# Patient Record
Sex: Female | Born: 1980 | Race: White | Hispanic: No | Marital: Married | Smoking: Never smoker
Health system: Southern US, Community
[De-identification: ages and names within clinical notes are randomized; demographics above are authoritative.]

## PROBLEM LIST (undated history)

## (undated) DIAGNOSIS — F419 Anxiety disorder, unspecified: Secondary | ICD-10-CM

## (undated) DIAGNOSIS — N200 Calculus of kidney: Secondary | ICD-10-CM

## (undated) HISTORY — DX: Anxiety disorder, unspecified: F41.9

## (undated) HISTORY — PX: SHOULDER SURGERY: SHX246

## (undated) HISTORY — PX: BUNIONECTOMY: SHX129

## (undated) HISTORY — DX: Calculus of kidney: N20.0

---

## 2012-08-22 DIAGNOSIS — F419 Anxiety disorder, unspecified: Secondary | ICD-10-CM | POA: Insufficient documentation

## 2012-09-14 DIAGNOSIS — N6001 Solitary cyst of right breast: Secondary | ICD-10-CM | POA: Insufficient documentation

## 2015-05-26 ENCOUNTER — Ambulatory Visit (INDEPENDENT_AMBULATORY_CARE_PROVIDER_SITE_OTHER): Payer: BLUE CROSS/BLUE SHIELD | Admitting: Family Medicine

## 2015-05-26 ENCOUNTER — Other Ambulatory Visit (HOSPITAL_COMMUNITY)
Admission: RE | Admit: 2015-05-26 | Discharge: 2015-05-26 | Disposition: A | Discharge status: Home / Self Care | Payer: BLUE CROSS/BLUE SHIELD | Source: Ambulatory Visit | Attending: Family Medicine | Admitting: Family Medicine

## 2015-05-26 ENCOUNTER — Encounter: Payer: Self-pay | Admitting: Family Medicine

## 2015-05-26 VITALS — BP 118/76 | HR 60 | Ht 70.5 in | Wt 141.4 lb

## 2015-05-26 DIAGNOSIS — D72819 Decreased white blood cell count, unspecified: Secondary | ICD-10-CM

## 2015-05-26 DIAGNOSIS — Z23 Encounter for immunization: Secondary | ICD-10-CM

## 2015-05-26 DIAGNOSIS — Z0000 Encounter for general adult medical examination without abnormal findings: Secondary | ICD-10-CM | POA: Diagnosis not present

## 2015-05-26 DIAGNOSIS — Z124 Encounter for screening for malignant neoplasm of cervix: Secondary | ICD-10-CM

## 2015-05-26 DIAGNOSIS — Z01419 Encounter for gynecological examination (general) (routine) without abnormal findings: Secondary | ICD-10-CM | POA: Diagnosis present

## 2015-05-26 DIAGNOSIS — Z1151 Encounter for screening for human papillomavirus (HPV): Secondary | ICD-10-CM | POA: Insufficient documentation

## 2015-05-26 LAB — COMPREHENSIVE METABOLIC PANEL
ALK PHOS: 39 U/L (ref 33–115)
ALT: 9 U/L (ref 6–29)
AST: 15 U/L (ref 10–30)
Albumin: 4.4 g/dL (ref 3.6–5.1)
BUN: 15 mg/dL (ref 7–25)
CALCIUM: 9 mg/dL (ref 8.6–10.2)
CHLORIDE: 107 mmol/L (ref 98–110)
CO2: 25 mmol/L (ref 20–31)
Creat: 0.8 mg/dL (ref 0.50–1.10)
Glucose, Bld: 82 mg/dL (ref 65–99)
POTASSIUM: 4.2 mmol/L (ref 3.5–5.3)
Sodium: 141 mmol/L (ref 135–146)
TOTAL PROTEIN: 6.8 g/dL (ref 6.1–8.1)
Total Bilirubin: 0.5 mg/dL (ref 0.2–1.2)

## 2015-05-26 LAB — CBC WITH DIFFERENTIAL/PLATELET
BASOS ABS: 0.1 10*3/uL (ref 0.0–0.1)
Basophils Relative: 2 % — ABNORMAL HIGH (ref 0–1)
EOS ABS: 0.1 10*3/uL (ref 0.0–0.7)
Eosinophils Relative: 3 % (ref 0–5)
HEMATOCRIT: 37.8 % (ref 36.0–46.0)
Hemoglobin: 12.5 g/dL (ref 12.0–15.0)
Lymphocytes Relative: 26 % (ref 12–46)
Lymphs Abs: 0.7 10*3/uL (ref 0.7–4.0)
MCH: 30.6 pg (ref 26.0–34.0)
MCHC: 33.1 g/dL (ref 30.0–36.0)
MCV: 92.6 fL (ref 78.0–100.0)
MONOS PCT: 12 % (ref 3–12)
MPV: 9.8 fL (ref 8.6–12.4)
Monocytes Absolute: 0.3 10*3/uL (ref 0.1–1.0)
NEUTROS PCT: 57 % (ref 43–77)
Neutro Abs: 1.5 10*3/uL — ABNORMAL LOW (ref 1.7–7.7)
PLATELETS: 220 10*3/uL (ref 150–400)
RBC: 4.08 MIL/uL (ref 3.87–5.11)
RDW: 12.7 % (ref 11.5–15.5)
WBC: 2.7 10*3/uL — ABNORMAL LOW (ref 4.0–10.5)

## 2015-05-26 LAB — POCT URINALYSIS DIPSTICK
Bilirubin, UA: NEGATIVE
Blood, UA: NEGATIVE
Glucose, UA: NEGATIVE
Ketones, UA: NEGATIVE
LEUKOCYTES UA: NEGATIVE
NITRITE UA: NEGATIVE
PH UA: 6
PROTEIN UA: NEGATIVE
Spec Grav, UA: >=1.03
UROBILINOGEN UA: NEGATIVE

## 2015-05-26 LAB — LIPID PANEL
CHOL/HDL RATIO: 2.5 ratio (ref <=5.0)
CHOLESTEROL: 163 mg/dL (ref 125–200)
HDL: 65 mg/dL (ref >=46)
LDL Cholesterol: 90 mg/dL (ref <130)
TRIGLYCERIDES: 40 mg/dL (ref <150)
VLDL: 8 mg/dL (ref <30)

## 2015-05-26 NOTE — Progress Notes (Signed)
Subjective:    Patient ID: Chelsea Stephenson, female    DOB: 07/27/1980, 35 y.o.   MRN: [id number]  HPI Chief Complaint  Patient presents with  . fasting cpe    fasting cpe, pap today. pt declines flu shot. no other concerns   She is new to the practice and here to establish primary care. She has no complaints today and is fasting. She would like a complete physical examination.  Other providers: Dr. Renda [facility]. [facility]- foot issues  Last physical exam: 3 years ago Pap smear: 3 years ago- always normal LMP: 05/08/2015 regular and no issues  Past medical history: none Past surgical history: bunionectomy, right shoulder surgery Family history: parents alive and healthy. MGM mixed ovarian and uterus cancer died at age 44, MGF- MI. PGM- stomach cancer. 2 siblings healthy.  Mammogram: never Colonoscopy: never  Immunizations: Tdap unknown, flu never.   Dentist: Dr. Caryn Section, up to date, yesterday Eye exam: annual exams, contact lenses.  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  Diet: good and healthy food.  Reviewed allergies, medications, past medical, surgical, social and family history.   Review of Systems Review of Systems Constitutional: -fever, -chills, -sweats, -unexpected weight change,-fatigue ENT: -runny nose, -ear pain, -sore throat Cardiology:  -chest pain, -palpitations, -edema Respiratory: -cough, -shortness of breath, -wheezing Gastroenterology: -abdominal pain, -nausea, -vomiting, -diarrhea, -constipation  Hematology: -bleeding or bruising problems Musculoskeletal: -arthralgias, -myalgias, -joint swelling, -back pain Ophthalmology: -vision changes Urology: -dysuria, -difficulty urinating, -hematuria, -urinary frequency, -urgency Neurology: -headache, -weakness, -tingling, -numbness       Objective:   Physical  Exam BP 118/76 mmHg  Pulse 60  Ht 5' 10.5" (1.791 m)  Wt 141 lb 6.4 oz (64.139 kg)  BMI 20.00 kg/m2  LMP 05/08/2015  General Appearance:    Alert, cooperative, no distress, appears stated age  Head:    Normocephalic, without obvious abnormality, atraumatic  Eyes:    PERRL, conjunctiva/corneas clear, EOM's intact, fundi    benign  Ears:    Normal TM's and external ear canals  Nose:   Nares normal, mucosa normal, no drainage or sinus   tenderness  Throat:   Lips, mucosa, and tongue normal; teeth and gums normal  Neck:   Supple, no lymphadenopathy;  thyroid:  no   enlargement/tenderness/nodules; no carotid   bruit or JVD  Back:    Spine nontender, no curvature, ROM normal, no CVA     tenderness  Lungs:     Clear to auscultation bilaterally without wheezes, rales or     ronchi; respirations unlabored  Chest Wall:    No tenderness or deformity   Heart:    Regular rate and rhythm, S1 and S2 normal, no murmur, rub   or gallop  Breast Exam:    No tenderness, masses, or nipple discharge or inversion.      No axillary lymphadenopathy  Abdomen:     Soft, non-tender, nondistended, normoactive bowel sounds,    no masses, no hepatosplenomegaly  Genitalia:    Normal external genitalia without lesions.  BUS and vagina normal; cervix without lesions, or cervical motion tenderness. No abnormal vaginal discharge.  Uterus and adnexa not enlarged, nontender, no masses.  Pap performed  Rectal:    Not performed due to age<40 and no related complaints  Extremities:   No clubbing, cyanosis or edema  Pulses:  2+ and symmetric all extremities  Skin:   Skin color, texture, turgor normal, no rashes or lesions  Lymph nodes:   Cervical, supraclavicular, and axillary nodes normal  Neurologic:   CNII-XII intact, normal strength, sensation and gait; reflexes 2+ and symmetric throughout          Psych:   Normal mood, affect, hygiene and grooming.     Urinalysis dipstick: negative     Assessment & Plan:  Routine  general medical examination at a health care facility - Plan: CBC with Differential/Platelet, Comprehensive metabolic panel, POCT urinalysis dipstick, Lipid panel  Need for Tdap vaccination - Plan: Tdap vaccine greater than or equal to 7yo IM  Screening for cervical cancer - Plan: Cytology - PAP  Recommend that she continue taking good care of herself by eating healthy and eating a minimum of 150 minutes of physical activity per week. She is currently exceeding this and doing well. Tdap given today. Refuses flu shot. Discussed health promotion and safety. Continue using sunscreen, wearing seatbelt and check smoke detectors.  Will follow-up pending lab results.

## 2015-05-26 NOTE — Patient Instructions (Signed)

## 2015-05-27 LAB — CYTOLOGY - PAP

## 2015-05-27 NOTE — Addendum Note (Signed)
Addended by: Minette Headland A on: 05/27/2015 09:04 AM   Modules accepted: Orders

## 2015-06-25 ENCOUNTER — Other Ambulatory Visit: Payer: BLUE CROSS/BLUE SHIELD

## 2015-06-25 DIAGNOSIS — D72819 Decreased white blood cell count, unspecified: Secondary | ICD-10-CM

## 2015-06-25 LAB — CBC WITH DIFFERENTIAL/PLATELET
BASOS PCT: 2 % — AB (ref 0–1)
Basophils Absolute: 0.1 10*3/uL (ref 0.0–0.1)
EOS ABS: 0.1 10*3/uL (ref 0.0–0.7)
EOS PCT: 3 % (ref 0–5)
HCT: 38.4 % (ref 36.0–46.0)
Hemoglobin: 12.6 g/dL (ref 12.0–15.0)
LYMPHS ABS: 1.2 10*3/uL (ref 0.7–4.0)
Lymphocytes Relative: 34 % (ref 12–46)
MCH: 30.5 pg (ref 26.0–34.0)
MCHC: 32.8 g/dL (ref 30.0–36.0)
MCV: 93 fL (ref 78.0–100.0)
MONOS PCT: 14 % — AB (ref 3–12)
MPV: 10 fL (ref 8.6–12.4)
Monocytes Absolute: 0.5 10*3/uL (ref 0.1–1.0)
NEUTROS PCT: 47 % (ref 43–77)
Neutro Abs: 1.6 10*3/uL — ABNORMAL LOW (ref 1.7–7.7)
Platelets: 224 10*3/uL (ref 150–400)
RBC: 4.13 MIL/uL (ref 3.87–5.11)
RDW: 13.2 % (ref 11.5–15.5)
WBC: 3.4 10*3/uL — ABNORMAL LOW (ref 4.0–10.5)

## 2015-09-20 ENCOUNTER — Encounter: Payer: Self-pay | Admitting: Family Medicine

## 2016-02-17 DIAGNOSIS — Z0100 Encounter for examination of eyes and vision without abnormal findings: Secondary | ICD-10-CM | POA: Diagnosis not present

## 2016-05-27 ENCOUNTER — Encounter: Payer: BLUE CROSS/BLUE SHIELD | Admitting: Family Medicine

## 2016-08-11 ENCOUNTER — Ambulatory Visit (INDEPENDENT_AMBULATORY_CARE_PROVIDER_SITE_OTHER): Payer: 59 | Admitting: Family Medicine

## 2016-08-11 ENCOUNTER — Encounter: Payer: Self-pay | Admitting: Family Medicine

## 2016-08-11 VITALS — BP 116/74 | HR 87 | Temp 98.0°F | Ht 70.5 in | Wt 141.2 lb

## 2016-08-11 DIAGNOSIS — E559 Vitamin D deficiency, unspecified: Secondary | ICD-10-CM

## 2016-08-11 DIAGNOSIS — F411 Generalized anxiety disorder: Secondary | ICD-10-CM

## 2016-08-11 DIAGNOSIS — Z0000 Encounter for general adult medical examination without abnormal findings: Secondary | ICD-10-CM | POA: Diagnosis not present

## 2016-08-11 DIAGNOSIS — R7989 Other specified abnormal findings of blood chemistry: Secondary | ICD-10-CM

## 2016-08-11 LAB — CBC WITH DIFFERENTIAL/PLATELET
Basophils Absolute: 0 10*3/uL (ref 0.0–0.1)
Basophils Relative: 1.4 % (ref 0.0–3.0)
Eosinophils Absolute: 0.1 10*3/uL (ref 0.0–0.7)
Eosinophils Relative: 2.1 % (ref 0.0–5.0)
HCT: 38.5 % (ref 36.0–46.0)
Hemoglobin: 13 g/dL (ref 12.0–15.0)
Lymphocytes Relative: 25.6 % (ref 12.0–46.0)
Lymphs Abs: 0.7 10*3/uL (ref 0.7–4.0)
MCHC: 33.6 g/dL (ref 30.0–36.0)
MCV: 94 fl (ref 78.0–100.0)
Monocytes Absolute: 0.3 10*3/uL (ref 0.1–1.0)
Monocytes Relative: 8.9 % (ref 3.0–12.0)
Neutro Abs: 1.8 10*3/uL (ref 1.4–7.7)
Neutrophils Relative %: 62 % (ref 43.0–77.0)
Platelets: 227 10*3/uL (ref 150.0–400.0)
RBC: 4.1 Mil/uL (ref 3.87–5.11)
RDW: 13.1 % (ref 11.5–15.5)
WBC: 2.9 10*3/uL — ABNORMAL LOW (ref 4.0–10.5)

## 2016-08-11 LAB — COMPREHENSIVE METABOLIC PANEL
ALT: 11 U/L (ref 0–35)
AST: 16 U/L (ref 0–37)
Albumin: 4.5 g/dL (ref 3.5–5.2)
Alkaline Phosphatase: 35 U/L — ABNORMAL LOW (ref 39–117)
BUN: 18 mg/dL (ref 6–23)
CO2: 27 mEq/L (ref 19–32)
Calcium: 9.6 mg/dL (ref 8.4–10.5)
Chloride: 106 mEq/L (ref 96–112)
Creatinine, Ser: 0.92 mg/dL (ref 0.40–1.20)
GFR: 72.82 mL/min (ref >60.00)
Glucose, Bld: 100 mg/dL — ABNORMAL HIGH (ref 70–99)
Potassium: 4.5 mEq/L (ref 3.5–5.1)
Sodium: 138 mEq/L (ref 135–145)
Total Bilirubin: 0.6 mg/dL (ref 0.2–1.2)
Total Protein: 7.3 g/dL (ref 6.0–8.3)

## 2016-08-11 LAB — TSH: TSH: 2 u[IU]/mL (ref 0.35–4.50)

## 2016-08-11 LAB — VITAMIN D 25 HYDROXY (VIT D DEFICIENCY, FRACTURES): VITD: 38.88 ng/mL (ref 30.00–100.00)

## 2016-08-11 MED ORDER — SERTRALINE HCL 50 MG PO TABS: 50.0000 mg | ORAL_TABLET | Freq: Every day | ORAL | 3 refills | Status: DC

## 2016-08-11 NOTE — Progress Notes (Signed)
Subjective:    Chelsea Stephenson is a 36 y.o. female and is here for a comprehensive physical exam.  Anxiety  Presents for initial visit. The problem has been unchanged. Symptoms include excessive worry and nervous/anxious behavior. Patient reports no chest pain, compulsions, confusion, decreased concentration, depressed mood, dizziness, dry mouth, feeling of choking, hyperventilation, insomnia, irritability, malaise, muscle tension, nausea, obsessions, palpitations, panic, shortness of breath or suicidal ideas. Symptoms occur most days. The severity of symptoms is moderate. The symptoms are aggravated by social activities. The quality of sleep is good. Nighttime awakenings: none.     Health Maintenance Due  Topic Date Due  . HIV Screening  02/17/1994    PMHx, SurgHx, SocialHx, Medications, and Allergies were reviewed in the Visit Navigator and updated as appropriate.   No past medical history on file.  Past Surgical History:  Procedure Laterality Date  . BUNIONECTOMY     Family History  Problem Relation Age of Onset  . Cancer Maternal Grandmother   . Heart disease Maternal Grandfather    Social History  Substance Use Topics  . Smoking status: Never Smoker  . Smokeless tobacco: Never Used  . Alcohol use 0.0 oz/week     Comment: occassionally   Review of Systems:   Review of Systems  Constitutional: Negative for chills, fever, irritability and malaise/fatigue.  HENT: Negative for congestion, ear pain, sinus pain and sore throat.   Eyes: Negative for blurred vision and double vision.  Respiratory: Negative for cough, shortness of breath and wheezing.   Cardiovascular: Negative for chest pain, palpitations and leg swelling.  Gastrointestinal: Negative for abdominal pain, constipation, diarrhea and nausea.  Genitourinary: Negative for dysuria.  Musculoskeletal: Negative for back pain, joint pain and neck pain.  Neurological: Negative for dizziness and headaches.    Psychiatric/Behavioral: Negative for confusion, decreased concentration, depression, hallucinations, memory loss and suicidal ideas. The patient is nervous/anxious. The patient does not have insomnia.    Objective:   BP 116/74   Pulse 87   Temp 98 F (36.7 C) (Oral)   Ht 5' 10.5" (1.791 m)   Wt 141 lb 3.2 oz (64 kg)   LMP 07/28/2016   SpO2 96%   BMI 19.97 kg/m   General Appearance:    Alert, cooperative, no distress, appears stated age  Head:    Normocephalic, without obvious abnormality, atraumatic  Eyes:    PERRL, conjunctiva/corneas clear, EOM's intact, fundi    benign, both eyes  Ears:    Normal TM's and external ear canals, both ears  Nose:   Nares normal, septum midline, mucosa normal  Throat:   Lips, mucosa, and tongue normal; teeth and gums normal  Neck:   Supple, symmetrical, trachea midline, no adenopathy;    thyroid:  no enlargement/tenderness/nodules; no carotid   bruit or JVD  Back:     Symmetric, no curvature, ROM normal, no CVA tenderness  Lungs:     Clear to auscultation bilaterally, respirations unlabored  Chest Wall:    No tenderness or deformity   Heart:    Regular rate and rhythm, S1 and S2 normal, no murmur, rub or gallop  Breast Exam:    No tenderness, masses, or nipple abnormality  Abdomen:     Soft, non-tender, bowel sounds, no masses, no organomegaly  Extremities:   Extremities normal, atraumatic, no cyanosis or edema  Pulses:   2+ and symmetric all extremities  Skin:   Skin color, texture, turgor normal, no rashes or lesions  Lymph nodes:  Cervical, supraclavicular, and axillary nodes normal  Neurologic:   CNII-XII intact, normal strength, sensation and reflexes throughout    Assessment/Plan:   [name] was seen today for annual exam.  Diagnoses and all orders for this visit:  Routine physical examination -     CBC with Differential/Platelet -     Comprehensive metabolic panel  GAD (generalized anxiety disorder) -     TSH -     sertraline  (ZOLOFT) 50 MG tablet; Take 1 tablet (50 mg total) by mouth at bedtime.  Low vitamin D level -     VITAMIN D 25 Hydroxy (Vit-D Deficiency, Fractures)   Well Adult Exam: Labs ordered: Yes. Patient counseling was done. See below for items discussed. Discussed the patient's BMI. The BMI BMI is in the acceptable range Follow up in one year.  Patient Counseling:   [x]     Nutrition: Stressed importance of moderation in sodium/caffeine intake, saturated fat and cholesterol, caloric balance, sufficient intake of fresh fruits, vegetables, fiber, calcium, iron, and 1 mg of folate supplement per day (for females capable of pregnancy).   [x]      Stressed the importance of regular exercise.    [x]     Substance Abuse: Discussed cessation/primary prevention of tobacco, alcohol, or other drug use; driving or other dangerous activities under the influence; availability of treatment for abuse.    [x]      Injury prevention: Discussed safety belts, safety helmets, smoke detector, smoking near bedding or upholstery.    [x]      Sexuality: Discussed sexually transmitted diseases, partner selection, use of condoms, avoidance of unintended pregnancy  and contraceptive alternatives.    [x]     Dental health: Discussed importance of regular tooth brushing, flossing, and dental visits.   [x]      Health maintenance and immunizations reviewed. Please refer to Health maintenance section.   Briscoe Deutscher, DO [place] [facility] [place] served as Education administrator during this visit. History, Physical, and Plan performed by medical provider. Documentation and orders reviewed and attested to. Briscoe Deutscher, D.O.

## 2016-08-11 NOTE — Progress Notes (Signed)
Pre visit review using our clinic review tool, if applicable. No additional management support is needed unless otherwise documented below in the visit note. 

## 2016-11-24 DIAGNOSIS — D225 Melanocytic nevi of trunk: Secondary | ICD-10-CM | POA: Diagnosis not present

## 2016-11-24 DIAGNOSIS — D1800 Hemangioma unspecified site: Secondary | ICD-10-CM | POA: Diagnosis not present

## 2016-11-24 DIAGNOSIS — D2272 Melanocytic nevi of left lower limb, including hip: Secondary | ICD-10-CM | POA: Diagnosis not present

## 2016-11-24 DIAGNOSIS — L814 Other melanin hyperpigmentation: Secondary | ICD-10-CM | POA: Diagnosis not present

## 2017-02-18 DIAGNOSIS — H5213 Myopia, bilateral: Secondary | ICD-10-CM | POA: Diagnosis not present

## 2017-02-18 DIAGNOSIS — H52221 Regular astigmatism, right eye: Secondary | ICD-10-CM | POA: Diagnosis not present

## 2017-12-05 DIAGNOSIS — B351 Tinea unguium: Secondary | ICD-10-CM | POA: Diagnosis not present

## 2017-12-05 DIAGNOSIS — L814 Other melanin hyperpigmentation: Secondary | ICD-10-CM | POA: Diagnosis not present

## 2017-12-05 DIAGNOSIS — D225 Melanocytic nevi of trunk: Secondary | ICD-10-CM | POA: Diagnosis not present

## 2017-12-05 DIAGNOSIS — D2272 Melanocytic nevi of left lower limb, including hip: Secondary | ICD-10-CM | POA: Diagnosis not present

## 2017-12-05 DIAGNOSIS — D1800 Hemangioma unspecified site: Secondary | ICD-10-CM | POA: Diagnosis not present

## 2018-01-05 ENCOUNTER — Encounter: Payer: Self-pay | Admitting: Family Medicine

## 2018-01-05 ENCOUNTER — Ambulatory Visit (INDEPENDENT_AMBULATORY_CARE_PROVIDER_SITE_OTHER): Payer: 59 | Admitting: Family Medicine

## 2018-01-05 VITALS — BP 110/68 | HR 62 | Temp 98.6°F | Ht 70.5 in | Wt 143.6 lb

## 2018-01-05 DIAGNOSIS — Z114 Encounter for screening for human immunodeficiency virus [HIV]: Secondary | ICD-10-CM

## 2018-01-05 DIAGNOSIS — R7989 Other specified abnormal findings of blood chemistry: Secondary | ICD-10-CM | POA: Diagnosis not present

## 2018-01-05 DIAGNOSIS — Z13220 Encounter for screening for lipoid disorders: Secondary | ICD-10-CM

## 2018-01-05 DIAGNOSIS — Z0000 Encounter for general adult medical examination without abnormal findings: Secondary | ICD-10-CM

## 2018-01-05 DIAGNOSIS — R739 Hyperglycemia, unspecified: Secondary | ICD-10-CM

## 2018-01-05 DIAGNOSIS — Z23 Encounter for immunization: Secondary | ICD-10-CM

## 2018-01-05 DIAGNOSIS — F411 Generalized anxiety disorder: Secondary | ICD-10-CM | POA: Diagnosis not present

## 2018-01-05 LAB — CBC WITH DIFFERENTIAL/PLATELET
Basophils Absolute: 0 10*3/uL (ref 0.0–0.1)
Basophils Relative: 1.1 % (ref 0.0–3.0)
Eosinophils Absolute: 0.1 10*3/uL (ref 0.0–0.7)
Eosinophils Relative: 1.7 % (ref 0.0–5.0)
HCT: 37.3 % (ref 36.0–46.0)
Hemoglobin: 12.6 g/dL (ref 12.0–15.0)
Lymphocytes Relative: 23.2 % (ref 12.0–46.0)
Lymphs Abs: 0.8 10*3/uL (ref 0.7–4.0)
MCHC: 33.8 g/dL (ref 30.0–36.0)
MCV: 94.6 fl (ref 78.0–100.0)
Monocytes Absolute: 0.3 10*3/uL (ref 0.1–1.0)
Monocytes Relative: 9 % (ref 3.0–12.0)
Neutro Abs: 2.2 10*3/uL (ref 1.4–7.7)
Neutrophils Relative %: 65 % (ref 43.0–77.0)
Platelets: 216 10*3/uL (ref 150.0–400.0)
RBC: 3.95 Mil/uL (ref 3.87–5.11)
RDW: 12.8 % (ref 11.5–15.5)
WBC: 3.3 10*3/uL — ABNORMAL LOW (ref 4.0–10.5)

## 2018-01-05 LAB — LIPID PANEL
Cholesterol: 150 mg/dL (ref 0–200)
HDL: 55.6 mg/dL (ref >39.00)
LDL Cholesterol: 86 mg/dL (ref 0–99)
NonHDL: 94.53
Total CHOL/HDL Ratio: 3
Triglycerides: 41 mg/dL (ref 0.0–149.0)
VLDL: 8.2 mg/dL (ref 0.0–40.0)

## 2018-01-05 LAB — COMPREHENSIVE METABOLIC PANEL
ALT: 10 U/L (ref 0–35)
AST: 15 U/L (ref 0–37)
Albumin: 4.6 g/dL (ref 3.5–5.2)
Alkaline Phosphatase: 37 U/L — ABNORMAL LOW (ref 39–117)
BUN: 24 mg/dL — ABNORMAL HIGH (ref 6–23)
CO2: 25 mEq/L (ref 19–32)
Calcium: 9.5 mg/dL (ref 8.4–10.5)
Chloride: 108 mEq/L (ref 96–112)
Creatinine, Ser: 0.88 mg/dL (ref 0.40–1.20)
GFR: 76.08 mL/min (ref >60.00)
Glucose, Bld: 97 mg/dL (ref 70–99)
Potassium: 4.5 mEq/L (ref 3.5–5.1)
Sodium: 140 mEq/L (ref 135–145)
Total Bilirubin: 0.5 mg/dL (ref 0.2–1.2)
Total Protein: 7.5 g/dL (ref 6.0–8.3)

## 2018-01-05 LAB — VITAMIN D 25 HYDROXY (VIT D DEFICIENCY, FRACTURES): VITD: 41.35 ng/mL (ref 30.00–100.00)

## 2018-01-05 LAB — HEMOGLOBIN A1C: Hgb A1c MFr Bld: 5.6 % (ref 4.6–6.5)

## 2018-01-05 NOTE — Patient Instructions (Addendum)
Deplin is 15 mg of active folate - you can find this on [employer].  Marland Kitchen.It takes about 2 weeks for protection to develop after vaccination.  There are many flu viruses, and they are always changing. Each year a new flu vaccine is made to protect against three or four viruses that are likely to cause disease in the upcoming flu season. Even when the vaccine doesn't exactly match these viruses, it may still provide some protection.   Influenza vaccine does not cause flu.  Influenza vaccine may be given at the same time as other vaccines.  3. Talk with your health care provider  Tell your vaccine provider if the person getting the vaccine: ; Has had an allergic reaction after a previous dose of influenza vaccine, or has any severe, life-threatening allergies.  ; Has ever had Guillain-Barr Syndrome (also called GBS).  In some cases, your health care provider may decide to postpone influenza vaccination to a future visit.  People with minor illnesses, such as a cold, may be vaccinated. People who are moderately or severely ill should usually wait until they recover before getting influenza vaccine.  Your health care provider can give you more information.  4. Risks of a reaction  ; Soreness, redness, and swelling where shot is given, fever, muscle aches, and headache can happen after influenza vaccine. ; There may be a very small increased risk of Guillain-Barr Syndrome (GBS) after inactivated influenza vaccine (the flu shot).  Young children who get the flu shot along with pneumococcal vaccine (PCV13), and/or DTaP vaccine at the same time might be slightly more likely to have a seizure caused by fever. Tell your health care provider if a child who is getting flu vaccine has ever had a seizure.  People sometimes faint after medical procedures, including vaccination. Tell your provider if you feel dizzy or have vision changes or ringing in the ears.  As with any medicine, there is a very remote  chance of a vaccine causing a severe allergic reaction, other serious injury, or death.  5. What if there is a serious problem?  An allergic reaction could occur after the vaccinated person leaves the clinic. If you see signs of a severe allergic reaction (hives, swelling of the face and throat, difficulty breathing, a fast heartbeat, dizziness, or weakness), call 9-1-1 and get the person to the nearest hospital.  For other signs that concern you, call your health care provider.  Adverse reactions should be reported to the Vaccine Adverse Event Reporting System (VAERS). Your health care provider will usually file this report, or you can do it yourself. Visit the VAERS website at www.vaers.SamedayNews.es or call 4705255592.  VAERS is only for reporting reactions, and VAERS staff do not give medical advice.  6. The National Vaccine Injury Compensation Program  The [employer] Vaccine Injury Compensation Program (VICP) is a federal program that was created to compensate people who may have been injured by certain vaccines. Visit the VICP website at GoldCloset.com.ee or call 703-884-2846 to learn about the program and about filing a claim. There is a time limit to file a claim for compensation.  7. How can I learn more?  ; Ask your health care provider.  ; Call your local or state health department. ; Contact the Centers for Disease Control and Prevention (CDC): - Call 650-818-0550 (1-800-CDC-INFO) or - Visit CDC's influenza website at https://gibson.com/  Vaccine Information Statement (Interim) Inactivated Influenza Vaccine  11/30/2017 42 U.S.C.  [phone]   Department of Health and  Geneticist, molecular for Disease Retail banker Use Only

## 2018-01-05 NOTE — Progress Notes (Signed)
Subjective:    Chelsea Stephenson is a 37 y.o. female and is here for a comprehensive physical exam.  Health Maintenance Due  Topic Date Due  . HIV Screening  02/17/1994   PMHx, SurgHx, SocialHx, Medications, and Allergies were reviewed in the Visit Navigator and updated as appropriate.   No past medical history on file.   Past Surgical History:  Procedure Laterality Date  . BUNIONECTOMY       Family History  Problem Relation Age of Onset  . Cancer Maternal Grandmother   . Heart disease Maternal Grandfather     Social History   Tobacco Use  . Smoking status: Never Smoker  . Smokeless tobacco: Never Used  Substance Use Topics  . Alcohol use: Yes    Alcohol/week: 0.0 standard drinks    Comment: occassionally  . Drug use: No    Review of Systems:   Pertinent items are noted in the HPI. Otherwise, ROS is negative.  Objective:   BP 110/68   Pulse 62   Temp 98.6 F (37 C) (Oral)   Ht 5' 10.5" (1.791 m)   Wt 143 lb 9.6 oz (65.1 kg)   LMP 01/05/2018   SpO2 98%   BMI 20.31 kg/m    General appearance: alert, cooperative and appears stated age. Head: normocephalic, without obvious abnormality, atraumatic. Neck: no adenopathy, supple, symmetrical, trachea midline; thyroid not enlarged, symmetric, no tenderness/mass/nodules. Lungs: clear to auscultation bilaterally. Heart: regular rate and rhythm Abdomen: soft, non-tender; no masses,  no organomegaly. Extremities: extremities normal, atraumatic, no cyanosis or edema. Skin: skin color, texture, turgor normal, no rashes or lesions. Lymph: cervical, supraclavicular, and axillary nodes normal; no abnormal inguinal nodes palpated. Neurologic: grossly normal.  Assessment/Plan:   Chelsea Stephenson was seen today for annual exam.  Diagnoses and all orders for this visit:  Routine physical examination  GAD (generalized anxiety disorder) Comments: Resolved.  Low vitamin D level -     VITAMIN D 25 Hydroxy (Vit-D Deficiency,  Fractures)  Screening for HIV (human immunodeficiency virus) -     HIV Antibody (routine testing w rflx)  Screening for lipid disorders -     Lipid panel  Abnormal CBC -     CBC with Differential/Platelet -     Comprehensive metabolic panel  Hyperglycemia -     Hemoglobin A1c -     Comprehensive metabolic panel  Need for immunization against influenza -     Flu Vaccine QUAD 36+ mos IM   Patient Counseling:   [x]     Nutrition: Stressed importance of moderation in sodium/caffeine intake, saturated fat and cholesterol, caloric balance, sufficient intake of fresh fruits, vegetables, fiber, calcium, iron, and 1 mg of folate supplement per day (for females capable of pregnancy).   [x]      Stressed the importance of regular exercise.    [x]     Substance Abuse: Discussed cessation/primary prevention of tobacco, alcohol, or other drug use; driving or other dangerous activities under the influence; availability of treatment for abuse.    [x]      Injury prevention: Discussed safety belts, safety helmets, smoke detector, smoking near bedding or upholstery.    [x]      Sexuality: Discussed sexually transmitted diseases, partner selection, use of condoms, avoidance of unintended pregnancy  and contraceptive alternatives.    [x]     Dental health: Discussed importance of regular tooth brushing, flossing, and dental visits.   [x]      Health maintenance and immunizations reviewed. Please refer to Health maintenance section.  Briscoe Deutscher, DO [place]

## 2018-01-06 ENCOUNTER — Encounter: Payer: Self-pay | Admitting: Family Medicine

## 2018-01-06 LAB — HIV ANTIBODY (ROUTINE TESTING W REFLEX): HIV 1&2 Ab, 4th Generation: NONREACTIVE

## 2018-01-16 ENCOUNTER — Other Ambulatory Visit: Payer: Self-pay

## 2018-01-16 ENCOUNTER — Ambulatory Visit (INDEPENDENT_AMBULATORY_CARE_PROVIDER_SITE_OTHER): Payer: 59 | Admitting: Family Medicine

## 2018-01-16 ENCOUNTER — Other Ambulatory Visit (INDEPENDENT_AMBULATORY_CARE_PROVIDER_SITE_OTHER): Payer: 59

## 2018-01-16 DIAGNOSIS — M2550 Pain in unspecified joint: Secondary | ICD-10-CM | POA: Diagnosis not present

## 2018-01-16 DIAGNOSIS — R5383 Other fatigue: Secondary | ICD-10-CM | POA: Insufficient documentation

## 2018-01-16 LAB — CBC WITH DIFFERENTIAL/PLATELET
BASOS ABS: 0.1 10*3/uL (ref 0.0–0.1)
BASOS PCT: 1.2 % (ref 0.0–3.0)
EOS PCT: 2.7 % (ref 0.0–5.0)
Eosinophils Absolute: 0.1 10*3/uL (ref 0.0–0.7)
HCT: 37.7 % (ref 36.0–46.0)
Hemoglobin: 12.8 g/dL (ref 12.0–15.0)
LYMPHS ABS: 1.2 10*3/uL (ref 0.7–4.0)
LYMPHS PCT: 27.3 % (ref 12.0–46.0)
MCHC: 34 g/dL (ref 30.0–36.0)
MCV: 94.3 fl (ref 78.0–100.0)
MONOS PCT: 9.8 % (ref 3.0–12.0)
Monocytes Absolute: 0.4 10*3/uL (ref 0.1–1.0)
NEUTROS ABS: 2.5 10*3/uL (ref 1.4–7.7)
NEUTROS PCT: 59 % (ref 43.0–77.0)
PLATELETS: 231 10*3/uL (ref 150.0–400.0)
RBC: 4 Mil/uL (ref 3.87–5.11)
RDW: 12.9 % (ref 11.5–15.5)
WBC: 4.3 10*3/uL (ref 4.0–10.5)

## 2018-01-16 LAB — COMPREHENSIVE METABOLIC PANEL
ALT: 13 U/L (ref 0–35)
AST: 19 U/L (ref 0–37)
Albumin: 4.4 g/dL (ref 3.5–5.2)
Alkaline Phosphatase: 41 U/L (ref 39–117)
BILIRUBIN TOTAL: 0.5 mg/dL (ref 0.2–1.2)
BUN: 18 mg/dL (ref 6–23)
CHLORIDE: 106 meq/L (ref 96–112)
CO2: 28 meq/L (ref 19–32)
Calcium: 9.4 mg/dL (ref 8.4–10.5)
Creatinine, Ser: 0.85 mg/dL (ref 0.40–1.20)
GFR: 79.17 mL/min (ref >60.00)
GLUCOSE: 91 mg/dL (ref 70–99)
Potassium: 4.2 mEq/L (ref 3.5–5.1)
Sodium: 140 mEq/L (ref 135–145)
Total Protein: 7.3 g/dL (ref 6.0–8.3)

## 2018-01-16 LAB — IBC PANEL
Iron: 70 ug/dL (ref 42–145)
SATURATION RATIOS: 20.4 % (ref 20.0–50.0)
TRANSFERRIN: 245 mg/dL (ref 212.0–360.0)

## 2018-01-16 LAB — TSH: TSH: 2.27 u[IU]/mL (ref 0.35–4.50)

## 2018-01-16 LAB — SEDIMENTATION RATE: SED RATE: 9 mm/h (ref 0–20)

## 2018-01-16 NOTE — Progress Notes (Signed)
Patient has some fatigue.  Getting labs to check  See orders,  No physical exam noted other than enlarge posterior lymph nodes, mild enlargement spleen  Will see if labs abnormal and then discuss

## 2018-01-17 LAB — EPSTEIN-BARR VIRUS VCA ANTIBODY PANEL: EBV NA IGG: 164 U/mL — AB

## 2018-12-10 DIAGNOSIS — L814 Other melanin hyperpigmentation: Secondary | ICD-10-CM | POA: Diagnosis not present

## 2018-12-10 DIAGNOSIS — D225 Melanocytic nevi of trunk: Secondary | ICD-10-CM | POA: Diagnosis not present

## 2018-12-10 DIAGNOSIS — D2272 Melanocytic nevi of left lower limb, including hip: Secondary | ICD-10-CM | POA: Diagnosis not present

## 2019-05-17 DIAGNOSIS — R87610 Atypical squamous cells of undetermined significance on cytologic smear of cervix (ASC-US): Secondary | ICD-10-CM | POA: Diagnosis not present

## 2019-05-17 DIAGNOSIS — Z713 Dietary counseling and surveillance: Secondary | ICD-10-CM | POA: Diagnosis not present

## 2019-05-17 DIAGNOSIS — Z13220 Encounter for screening for lipoid disorders: Secondary | ICD-10-CM | POA: Diagnosis not present

## 2019-05-17 DIAGNOSIS — Z124 Encounter for screening for malignant neoplasm of cervix: Secondary | ICD-10-CM | POA: Diagnosis not present

## 2019-05-17 DIAGNOSIS — Z681 Body mass index (BMI) 19 or less, adult: Secondary | ICD-10-CM | POA: Diagnosis not present

## 2019-05-17 DIAGNOSIS — Z01419 Encounter for gynecological examination (general) (routine) without abnormal findings: Secondary | ICD-10-CM | POA: Diagnosis not present

## 2019-05-17 DIAGNOSIS — Z01411 Encounter for gynecological examination (general) (routine) with abnormal findings: Secondary | ICD-10-CM | POA: Diagnosis not present

## 2019-05-17 DIAGNOSIS — Z7182 Exercise counseling: Secondary | ICD-10-CM | POA: Diagnosis not present

## 2019-05-17 DIAGNOSIS — Z0000 Encounter for general adult medical examination without abnormal findings: Secondary | ICD-10-CM | POA: Diagnosis not present

## 2019-06-11 DIAGNOSIS — Z0000 Encounter for general adult medical examination without abnormal findings: Secondary | ICD-10-CM | POA: Diagnosis not present

## 2019-06-11 DIAGNOSIS — Z1231 Encounter for screening mammogram for malignant neoplasm of breast: Secondary | ICD-10-CM | POA: Diagnosis not present

## 2019-07-01 DIAGNOSIS — R928 Other abnormal and inconclusive findings on diagnostic imaging of breast: Secondary | ICD-10-CM | POA: Diagnosis not present

## 2019-07-01 DIAGNOSIS — R922 Inconclusive mammogram: Secondary | ICD-10-CM | POA: Diagnosis not present

## 2019-07-01 DIAGNOSIS — N6489 Other specified disorders of breast: Secondary | ICD-10-CM | POA: Diagnosis not present

## 2019-11-22 ENCOUNTER — Other Ambulatory Visit: Payer: Self-pay | Admitting: Family Medicine

## 2019-11-22 MED ORDER — TIZANIDINE HCL 4 MG PO TABS: 4.0000 mg | ORAL_TABLET | Freq: Every evening | ORAL | 2 refills | Status: AC

## 2019-11-22 MED ORDER — MELOXICAM 15 MG PO TABS: 15.0000 mg | ORAL_TABLET | Freq: Every day | ORAL | 0 refills | Status: DC

## 2019-11-22 MED FILL — MELOXICAM 15 MG TABLET: 15 | 30 days supply | Qty: 30 | Fill #0

## 2019-11-22 MED FILL — tiZANidine HCL 4 MG TABS: 4 | 30 days supply | Qty: 30 | Fill #0

## 2019-11-22 NOTE — Progress Notes (Unsigned)
Patient was having more of a muscle spasm sent in medications including Zanaflex and meloxicam

## 2019-12-18 DIAGNOSIS — L578 Other skin changes due to chronic exposure to nonionizing radiation: Secondary | ICD-10-CM | POA: Diagnosis not present

## 2019-12-18 DIAGNOSIS — D2272 Melanocytic nevi of left lower limb, including hip: Secondary | ICD-10-CM | POA: Diagnosis not present

## 2019-12-18 DIAGNOSIS — D225 Melanocytic nevi of trunk: Secondary | ICD-10-CM | POA: Diagnosis not present

## 2019-12-18 DIAGNOSIS — L814 Other melanin hyperpigmentation: Secondary | ICD-10-CM | POA: Diagnosis not present

## 2020-03-17 ENCOUNTER — Other Ambulatory Visit: Payer: Self-pay | Admitting: Family Medicine

## 2020-03-17 MED ORDER — FLUCONAZOLE 150 MG PO TABS: 150.0000 mg | ORAL_TABLET | ORAL | 0 refills | Status: DC

## 2020-03-17 MED ORDER — DOXYCYCLINE HYCLATE 100 MG PO TABS: 100.0000 mg | ORAL_TABLET | Freq: Two times a day (BID) | ORAL | 0 refills | Status: DC

## 2020-03-17 MED FILL — DOXYCYCLINE HYCLATE 100 MG: 100 | 7 days supply | Qty: 14 | Fill #0

## 2020-03-17 MED FILL — FLUCONAZOLE 150 MG TABS: 150 | 84 days supply | Qty: 12 | Fill #0

## 2020-03-17 NOTE — Progress Notes (Signed)
Toenail possible hematoma vs infection, vitals at home are stable patient states pain with activity and then at night  Start on fluconazole 1 time a week for 12 weeks for fungal.  Then doxy 2 times a day for 1 week.  Warned of potential side effects  Fenestration recommended to toenail due to underlying hematoma.  Patient would liek to try on own or we will do in office at later date

## 2020-03-18 ENCOUNTER — Ambulatory Visit (INDEPENDENT_AMBULATORY_CARE_PROVIDER_SITE_OTHER): Payer: 59

## 2020-03-18 ENCOUNTER — Other Ambulatory Visit: Payer: Self-pay | Admitting: Family Medicine

## 2020-03-18 DIAGNOSIS — M7989 Other specified soft tissue disorders: Secondary | ICD-10-CM | POA: Diagnosis not present

## 2020-03-18 DIAGNOSIS — M79671 Pain in right foot: Secondary | ICD-10-CM

## 2020-03-18 NOTE — Progress Notes (Signed)
Right foot pain

## 2020-07-23 ENCOUNTER — Other Ambulatory Visit (HOSPITAL_COMMUNITY): Payer: Self-pay

## 2020-07-23 ENCOUNTER — Other Ambulatory Visit: Payer: Self-pay | Admitting: Family Medicine

## 2020-07-23 MED ORDER — KETOCONAZOLE 2 % EX CREA
1.0000 "application " | TOPICAL_CREAM | Freq: Two times a day (BID) | CUTANEOUS | 3 refills | Status: DC
  Filled 2020-07-23: qty 60, 30d supply, fill #0

## 2020-07-23 NOTE — Progress Notes (Signed)
Had skin lesion, round, red raised, circular, looked to be ring worm Send in medicine

## 2020-08-31 ENCOUNTER — Other Ambulatory Visit (HOSPITAL_COMMUNITY): Payer: Self-pay

## 2020-08-31 DIAGNOSIS — Z1239 Encounter for other screening for malignant neoplasm of breast: Secondary | ICD-10-CM | POA: Diagnosis not present

## 2020-08-31 DIAGNOSIS — F419 Anxiety disorder, unspecified: Secondary | ICD-10-CM | POA: Diagnosis not present

## 2020-08-31 DIAGNOSIS — R7989 Other specified abnormal findings of blood chemistry: Secondary | ICD-10-CM | POA: Diagnosis not present

## 2020-08-31 DIAGNOSIS — Z0000 Encounter for general adult medical examination without abnormal findings: Secondary | ICD-10-CM | POA: Diagnosis not present

## 2020-08-31 MED ORDER — ESCITALOPRAM OXALATE 10 MG PO TABS
ORAL_TABLET | ORAL | 2 refills | Status: DC
  Filled 2020-08-31: qty 30, 30d supply, fill #0

## 2020-10-14 ENCOUNTER — Other Ambulatory Visit (INDEPENDENT_AMBULATORY_CARE_PROVIDER_SITE_OTHER): Payer: 59

## 2020-10-14 ENCOUNTER — Other Ambulatory Visit: Payer: Self-pay

## 2020-10-14 ENCOUNTER — Other Ambulatory Visit: Payer: 59

## 2020-10-14 ENCOUNTER — Encounter: Payer: Self-pay | Admitting: Family Medicine

## 2020-10-14 ENCOUNTER — Other Ambulatory Visit: Payer: Self-pay | Admitting: Family Medicine

## 2020-10-14 ENCOUNTER — Other Ambulatory Visit (HOSPITAL_COMMUNITY): Payer: Self-pay

## 2020-10-14 DIAGNOSIS — R195 Other fecal abnormalities: Secondary | ICD-10-CM

## 2020-10-14 DIAGNOSIS — R197 Diarrhea, unspecified: Secondary | ICD-10-CM | POA: Diagnosis not present

## 2020-10-14 LAB — CBC WITH DIFFERENTIAL/PLATELET
Basophils Absolute: 0 10*3/uL (ref 0.0–0.1)
Basophils Relative: 0.6 % (ref 0.0–3.0)
Eosinophils Absolute: 0 10*3/uL (ref 0.0–0.7)
Eosinophils Relative: 0.4 % (ref 0.0–5.0)
HCT: 40.8 % (ref 36.0–46.0)
Hemoglobin: 13.6 g/dL (ref 12.0–15.0)
Lymphocytes Relative: 12.2 % (ref 12.0–46.0)
Lymphs Abs: 1 10*3/uL (ref 0.7–4.0)
MCHC: 33.3 g/dL (ref 30.0–36.0)
MCV: 94.9 fl (ref 78.0–100.0)
Monocytes Absolute: 0.8 10*3/uL (ref 0.1–1.0)
Monocytes Relative: 10 % (ref 3.0–12.0)
Neutro Abs: 6.1 10*3/uL (ref 1.4–7.7)
Neutrophils Relative %: 76.8 % (ref 43.0–77.0)
Platelets: 208 10*3/uL (ref 150.0–400.0)
RBC: 4.3 Mil/uL (ref 3.87–5.11)
RDW: 13.4 % (ref 11.5–15.5)
WBC: 8 10*3/uL (ref 4.0–10.5)

## 2020-10-14 LAB — COMPREHENSIVE METABOLIC PANEL
ALT: 12 U/L (ref 0–35)
AST: 16 U/L (ref 0–37)
Albumin: 4.7 g/dL (ref 3.5–5.2)
Alkaline Phosphatase: 43 U/L (ref 39–117)
BUN: 10 mg/dL (ref 6–23)
CO2: 28 mEq/L (ref 19–32)
Calcium: 9.3 mg/dL (ref 8.4–10.5)
Chloride: 103 mEq/L (ref 96–112)
Creatinine, Ser: 0.88 mg/dL (ref 0.40–1.20)
GFR: 81.49 mL/min (ref >60.00)
Glucose, Bld: 98 mg/dL (ref 70–99)
Potassium: 4.6 mEq/L (ref 3.5–5.1)
Sodium: 138 mEq/L (ref 135–145)
Total Bilirubin: 0.4 mg/dL (ref 0.2–1.2)
Total Protein: 7.8 g/dL (ref 6.0–8.3)

## 2020-10-14 MED ORDER — AZITHROMYCIN 500 MG PO TABS
500.0000 mg | ORAL_TABLET | Freq: Every day | ORAL | 0 refills | Status: DC
  Filled 2020-10-14: qty 3, 3d supply, fill #0

## 2020-10-14 NOTE — Progress Notes (Signed)
Patient has had 2 days of nonbloody diarrhea afebrile.  Discussed with Dr. Havery Moros as well.  Potential treatment.  Patient has not had no sick contacts.  No recent COVID encounters.  Patient's vitals seem to be stable.  We will get laboratory work-up including CBC and c-Met and will get a GI pathogen panel give 3 days worth of azithromycin and did discuss the possibility of E. Coli SHIGA TOXIN as a potential but if patient does get worse to start taking the antibiotics.  Patient verbalized understanding

## 2020-10-14 NOTE — Progress Notes (Signed)
See phone note documentation

## 2020-10-16 ENCOUNTER — Other Ambulatory Visit (HOSPITAL_COMMUNITY): Payer: Self-pay

## 2020-10-16 LAB — GI PROFILE, STOOL, PCR
Adenovirus F 40/41: NOT DETECTED
Astrovirus: NOT DETECTED
C difficile toxin A/B: NOT DETECTED
Campylobacter: NOT DETECTED
Cryptosporidium: NOT DETECTED
Cyclospora cayetanensis: NOT DETECTED
Entamoeba histolytica: NOT DETECTED
Enteroaggregative E coli: NOT DETECTED
Enteropathogenic E coli: NOT DETECTED
Enterotoxigenic E coli: NOT DETECTED
Giardia lamblia: NOT DETECTED
Norovirus GI/GII: NOT DETECTED
Plesiomonas shigelloides: NOT DETECTED
Rotavirus A: NOT DETECTED
Salmonella: DETECTED — AB
Sapovirus: NOT DETECTED
Shiga-toxin-producing E coli: NOT DETECTED
Shigella/Enteroinvasive E coli: NOT DETECTED
Vibrio cholerae: NOT DETECTED
Vibrio: NOT DETECTED
Yersinia enterocolitica: NOT DETECTED

## 2020-10-16 MED ORDER — AZITHROMYCIN 250 MG PO TABS
500.0000 mg | ORAL_TABLET | Freq: Every day | ORAL | 0 refills | Status: DC
  Filled 2020-10-16: qty 6, 3d supply, fill #0

## 2020-10-16 NOTE — Progress Notes (Signed)
Positive for salmonella,  Already treated with azithro for 3 days, will extend another 3 days

## 2020-11-11 ENCOUNTER — Encounter: Payer: Self-pay | Admitting: Family Medicine

## 2020-12-22 ENCOUNTER — Other Ambulatory Visit (HOSPITAL_COMMUNITY): Payer: Self-pay

## 2020-12-22 DIAGNOSIS — L578 Other skin changes due to chronic exposure to nonionizing radiation: Secondary | ICD-10-CM | POA: Diagnosis not present

## 2020-12-22 DIAGNOSIS — L814 Other melanin hyperpigmentation: Secondary | ICD-10-CM | POA: Diagnosis not present

## 2020-12-22 DIAGNOSIS — D2272 Melanocytic nevi of left lower limb, including hip: Secondary | ICD-10-CM | POA: Diagnosis not present

## 2020-12-22 DIAGNOSIS — D485 Neoplasm of uncertain behavior of skin: Secondary | ICD-10-CM | POA: Diagnosis not present

## 2020-12-22 DIAGNOSIS — L603 Nail dystrophy: Secondary | ICD-10-CM | POA: Diagnosis not present

## 2020-12-22 DIAGNOSIS — D225 Melanocytic nevi of trunk: Secondary | ICD-10-CM | POA: Diagnosis not present

## 2020-12-22 MED ORDER — CICLOPIROX 8 % EX SOLN
CUTANEOUS | 2 refills | Status: DC
  Filled 2020-12-22: qty 6.6, 30d supply, fill #0

## 2021-01-15 ENCOUNTER — Other Ambulatory Visit (HOSPITAL_BASED_OUTPATIENT_CLINIC_OR_DEPARTMENT_OTHER): Payer: Self-pay | Admitting: Family Medicine

## 2021-01-15 ENCOUNTER — Telehealth (HOSPITAL_BASED_OUTPATIENT_CLINIC_OR_DEPARTMENT_OTHER): Payer: Self-pay

## 2021-01-15 DIAGNOSIS — Z1231 Encounter for screening mammogram for malignant neoplasm of breast: Secondary | ICD-10-CM

## 2021-01-25 ENCOUNTER — Other Ambulatory Visit (HOSPITAL_BASED_OUTPATIENT_CLINIC_OR_DEPARTMENT_OTHER): Payer: Self-pay | Admitting: Family Medicine

## 2021-01-25 DIAGNOSIS — Z1231 Encounter for screening mammogram for malignant neoplasm of breast: Secondary | ICD-10-CM

## 2021-03-15 ENCOUNTER — Ambulatory Visit (HOSPITAL_BASED_OUTPATIENT_CLINIC_OR_DEPARTMENT_OTHER)
Admission: RE | Admit: 2021-03-15 | Discharge: 2021-03-15 | Disposition: A | Discharge status: Home / Self Care | Payer: 59 | Source: Ambulatory Visit | Attending: Family Medicine | Admitting: Family Medicine

## 2021-03-15 ENCOUNTER — Other Ambulatory Visit: Payer: Self-pay

## 2021-03-15 DIAGNOSIS — Z1231 Encounter for screening mammogram for malignant neoplasm of breast: Secondary | ICD-10-CM | POA: Diagnosis not present

## 2021-12-07 DIAGNOSIS — D72819 Decreased white blood cell count, unspecified: Secondary | ICD-10-CM | POA: Diagnosis not present

## 2021-12-07 DIAGNOSIS — Z0000 Encounter for general adult medical examination without abnormal findings: Secondary | ICD-10-CM | POA: Diagnosis not present

## 2021-12-07 DIAGNOSIS — R195 Other fecal abnormalities: Secondary | ICD-10-CM | POA: Diagnosis not present

## 2021-12-07 DIAGNOSIS — Z13220 Encounter for screening for lipoid disorders: Secondary | ICD-10-CM | POA: Diagnosis not present

## 2022-01-10 DIAGNOSIS — D225 Melanocytic nevi of trunk: Secondary | ICD-10-CM | POA: Diagnosis not present

## 2022-01-10 DIAGNOSIS — L814 Other melanin hyperpigmentation: Secondary | ICD-10-CM | POA: Diagnosis not present

## 2022-01-10 DIAGNOSIS — L578 Other skin changes due to chronic exposure to nonionizing radiation: Secondary | ICD-10-CM | POA: Diagnosis not present

## 2022-01-10 DIAGNOSIS — D485 Neoplasm of uncertain behavior of skin: Secondary | ICD-10-CM | POA: Diagnosis not present

## 2022-01-10 DIAGNOSIS — D2272 Melanocytic nevi of left lower limb, including hip: Secondary | ICD-10-CM | POA: Diagnosis not present

## 2022-02-14 ENCOUNTER — Other Ambulatory Visit (INDEPENDENT_AMBULATORY_CARE_PROVIDER_SITE_OTHER): Payer: 59

## 2022-02-14 ENCOUNTER — Other Ambulatory Visit: Payer: Self-pay | Admitting: Family Medicine

## 2022-02-14 ENCOUNTER — Other Ambulatory Visit: Payer: Self-pay

## 2022-02-14 ENCOUNTER — Other Ambulatory Visit (HOSPITAL_COMMUNITY): Payer: Self-pay

## 2022-02-14 DIAGNOSIS — R5383 Other fatigue: Secondary | ICD-10-CM

## 2022-02-14 DIAGNOSIS — M2550 Pain in unspecified joint: Secondary | ICD-10-CM

## 2022-02-14 LAB — CBC WITH DIFFERENTIAL/PLATELET
Basophils Absolute: 0.1 10*3/uL (ref 0.0–0.1)
Basophils Relative: 1.2 % (ref 0.0–3.0)
Eosinophils Absolute: 0.1 10*3/uL (ref 0.0–0.7)
Eosinophils Relative: 2.3 % (ref 0.0–5.0)
HCT: 37.9 % (ref 36.0–46.0)
Hemoglobin: 12.8 g/dL (ref 12.0–15.0)
Lymphocytes Relative: 26.8 % (ref 12.0–46.0)
Lymphs Abs: 1.2 10*3/uL (ref 0.7–4.0)
MCHC: 33.8 g/dL (ref 30.0–36.0)
MCV: 94.9 fl (ref 78.0–100.0)
Monocytes Absolute: 0.4 10*3/uL (ref 0.1–1.0)
Monocytes Relative: 10.3 % (ref 3.0–12.0)
Neutro Abs: 2.6 10*3/uL (ref 1.4–7.7)
Neutrophils Relative %: 59.4 % (ref 43.0–77.0)
Platelets: 242 10*3/uL (ref 150.0–400.0)
RBC: 4 Mil/uL (ref 3.87–5.11)
RDW: 13.3 % (ref 11.5–15.5)
WBC: 4.4 10*3/uL (ref 4.0–10.5)

## 2022-02-14 LAB — IBC PANEL
Iron: 59 ug/dL (ref 42–145)
Saturation Ratios: 20.1 % (ref 20.0–50.0)
TIBC: 294 ug/dL (ref 250.0–450.0)
Transferrin: 210 mg/dL — ABNORMAL LOW (ref 212.0–360.0)

## 2022-02-14 LAB — VITAMIN B12: Vitamin B-12: 523 pg/mL (ref 211–911)

## 2022-02-14 LAB — COMPREHENSIVE METABOLIC PANEL
ALT: 11 U/L (ref 0–35)
AST: 17 U/L (ref 0–37)
Albumin: 4.5 g/dL (ref 3.5–5.2)
Alkaline Phosphatase: 48 U/L (ref 39–117)
BUN: 19 mg/dL (ref 6–23)
CO2: 24 mEq/L (ref 19–32)
Calcium: 9 mg/dL (ref 8.4–10.5)
Chloride: 107 mEq/L (ref 96–112)
Creatinine, Ser: 0.83 mg/dL (ref 0.40–1.20)
GFR: 86.6 mL/min (ref >60.00)
Glucose, Bld: 91 mg/dL (ref 70–99)
Potassium: 3.9 mEq/L (ref 3.5–5.1)
Sodium: 138 mEq/L (ref 135–145)
Total Bilirubin: 0.4 mg/dL (ref 0.2–1.2)
Total Protein: 7.5 g/dL (ref 6.0–8.3)

## 2022-02-14 LAB — SEDIMENTATION RATE: Sed Rate: 10 mm/hr (ref 0–20)

## 2022-02-14 LAB — FERRITIN: Ferritin: 35.3 ng/mL (ref 10.0–291.0)

## 2022-02-14 LAB — VITAMIN D 25 HYDROXY (VIT D DEFICIENCY, FRACTURES): VITD: 42.3 ng/mL (ref 30.00–100.00)

## 2022-02-14 LAB — TSH: TSH: 1.96 u[IU]/mL (ref 0.35–5.50)

## 2022-02-14 MED ORDER — AZITHROMYCIN 500 MG PO TABS
500.0000 mg | ORAL_TABLET | Freq: Every day | ORAL | 0 refills | Status: DC
  Filled 2022-02-14: qty 3, 3d supply, fill #0

## 2022-02-14 MED ORDER — PREDNISONE 20 MG PO TABS
20.0000 mg | ORAL_TABLET | Freq: Every day | ORAL | 0 refills | Status: DC
  Filled 2022-02-14: qty 7, 7d supply, fill #0

## 2022-02-14 MED ORDER — ALBUTEROL SULFATE HFA 108 (90 BASE) MCG/ACT IN AERS
2.0000 | INHALATION_SPRAY | RESPIRATORY_TRACT | 6 refills | Status: DC | PRN
  Filled 2022-02-14: qty 6.7, 16d supply, fill #0

## 2022-02-14 NOTE — Progress Notes (Addendum)
Continue dizziness.  Heavy menstruation  Severe fatigue and headache been going on for months now   Will check labs and see if any abnormal findings   Labs show no infectious etiology.  Quick chest x-ray scout does show some positive bowl reactive airway disease with some bronchial thickening.  We will start with prednisone as well as albuterol and treatment to pretreat before workouts see how patient responds.

## 2022-02-14 NOTE — Addendum Note (Signed)
Addended by: Lyndal Pulley on: 02/14/2022 03:28 PM   Modules accepted: Orders

## 2022-02-14 NOTE — Addendum Note (Signed)
Addended by: Lyndal Pulley on: 02/14/2022 04:51 PM   Modules accepted: Orders

## 2022-02-15 LAB — D-DIMER, QUANTITATIVE: D-Dimer, Quant: 0.23 mcg/mL FEU (ref <0.50)

## 2022-02-24 ENCOUNTER — Ambulatory Visit (INDEPENDENT_AMBULATORY_CARE_PROVIDER_SITE_OTHER): Payer: 59

## 2022-02-24 ENCOUNTER — Ambulatory Visit (INDEPENDENT_AMBULATORY_CARE_PROVIDER_SITE_OTHER): Payer: 59 | Admitting: Family Medicine

## 2022-02-24 VITALS — BP 110/80 | HR 62 | Ht 70.5 in

## 2022-02-24 DIAGNOSIS — R079 Chest pain, unspecified: Secondary | ICD-10-CM

## 2022-02-24 DIAGNOSIS — R5383 Other fatigue: Secondary | ICD-10-CM

## 2022-02-24 NOTE — Assessment & Plan Note (Signed)
Continued fatigue with chest pain.  EKG taken today and evaluated which appears to be normal.  Some mild inversion of the T waves noted.  These are in the anterior septal but does not appear significantly abnormal.  In addition of this patient has had laboratory work-up including a negative D-dimer.  No significant signs of inflammation noted. Chest x-ray is pending.  Patient has done prednisone as well as as well as a azithromycin for the questionable atypical pneumonia.  Unfortunately continues to have chest discomfort.  Patient's fatigue is significantly out of proportion for her and for the findings so far.  Considering the possibility of CT scan of the chest without contrast to further evaluate for any other reactive airway disease, consider the possibility of echocardiogram to rule out any type of other structural heart that could be contributing and may be referral to cardiology to see if a monitor is not necessary.  Patient continues to also have a headache that is out of proportion and may need to consider MRI with and without contrast if this continues.  No family history of any aneurysm.  Follow-up with me again as soon as further testing is done.

## 2022-02-24 NOTE — Progress Notes (Signed)
Chelsea Stephenson Phone: [phone] Subjective:    I'm seeing this patient by the request  of:  Katherina Mires, MD  CC: Fatigue, chest pain  GUR:[id number]  Chelsea Stephenson is a 41 y.o. female coming in with complaint of fatigue and chest discomfort.  Patient very pleasant 41 year old female who is highly active and eating for the last month has had some difficulty with shortness of breath, anterior chest discomfort, that is associated with dizziness.  Patient denies any recent illnesses at this time.  No other medication changes.  Patient has been able to work but does does not feel like herself.  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  Notices at the end of a long day has more difficulty.  Worsening chest pain with different changes in position or even relief with more of a flexed positioning.  Patient denies any neck pain, any radiation down the arm, patient does state though that it is associated with a headache.  Seem to start on the left temporal area and now more diffuse.  Patient describes the discomfort as more of a constant with no visual changes other than what is consistent with some of the dizziness.  Laboratory work-up was done initially including a D-dimer which was negative, iron panel did appear to be somewhat lower but has been doing iron supplementation without any significant improvement.  Treatments that we have tried included prednisone as well as azithromycin for the questionable postviral pleuritic chest pain.  States that it did not help any of her symptoms including her fatigue.    No past medical history on file. Past Surgical History:  Procedure Laterality Date   BUNIONECTOMY     Social History   Socioeconomic History   Marital status: Married    Spouse name: Not on file   Number of  children: Not on file   Years of education: Not on file   Highest education level: Not on file  Occupational History   Not on file  Tobacco Use   Smoking status: Never   Smokeless tobacco: Never  Substance and Sexual Activity   Alcohol use: Yes    Alcohol/week: 0.0 standard drinks of alcohol    Comment: occassionally   Drug use: No   Sexual activity: Yes    Birth control/protection: Other-see comments    Comment: same sex partner  Other Topics Concern   Not on file  Social History Narrative   Not on file   Social Determinants of Health   Financial Resource Strain: Not on file  Food Insecurity: Not on file  Transportation Needs: Not on file  Physical Activity: Not on file  Stress: Not on file  Social Connections: Not on file   No Known Allergies Family History  Problem Relation Age of Onset   Cancer Maternal Grandmother    Heart disease Maternal Grandfather     Current Outpatient Medications (Endocrine & Metabolic):    predniSONE (DELTASONE) 20 MG tablet, Take 1 tablet (20 mg total) by mouth daily with breakfast.   Current Outpatient Medications (Respiratory):    albuterol (VENTOLIN HFA) 108 (90 Base) MCG/ACT inhaler, Inhale 2 puffs into the lungs every 4 (four) hours as needed for wheezing or shortness of breath.  Current Outpatient Medications (Analgesics):    meloxicam (MOBIC) 15 MG tablet, Take 1 tablet (15 mg  total) by mouth daily.   Current Outpatient Medications (Other):    azithromycin (ZITHROMAX) 500 MG tablet, Take 1 tablet (500 mg total) by mouth daily.   ciclopirox (PENLAC) 8 % solution, Apply 1 application externally once a day   Multiple Vitamins-Minerals (MULTIVITAMIN ADULT PO), Take 1 tablet by mouth daily.   OVER THE COUNTER MEDICATION, Take 15 mg by mouth daily. [place]   Vitamin D, Cholecalciferol, 1000 units CAPS, Take 1 capsule by mouth daily.   Reviewed prior external information including notes and imaging from  primary care provider As  well as notes that were available from care everywhere and other healthcare systems.  Past medical history, social, surgical and family history all reviewed in electronic medical record.  No pertanent information unless stated regarding to the chief complaint.   Review of Systems:  No  visual changes, nausea, vomiting, diarrhea, constipation,  abdominal pain, skin rash, fevers, chills, night sweats, weight loss, swollen lymph nodes,, joint swelling, chest pain, shortness of breath, mood changes. POSITIVE muscle aches, body aches, dizziness, headache  Objective  Blood pressure 110/80, pulse 62, height 5' 10.5" (1.791 m).   General: No apparent distress alert and oriented x3 mood and affect normal, dressed appropriately.  HEENT: Pupils equal, extraocular movements intact  Respiratory: Patient's speak in full sentences and does not appear short of breath  Cardiovascular: No lower extremity edema, non tender, no erythema, clear to auscultation bilaterally at the moment.     Impression and Recommendations:    The above documentation has been reviewed and is accurate and complete Lyndal Pulley, DO

## 2022-02-25 ENCOUNTER — Ambulatory Visit
Admission: RE | Admit: 2022-02-25 | Discharge: 2022-02-25 | Disposition: A | Discharge status: Home / Self Care | Payer: 59 | Source: Ambulatory Visit | Attending: Family Medicine | Admitting: Family Medicine

## 2022-02-25 DIAGNOSIS — R079 Chest pain, unspecified: Secondary | ICD-10-CM

## 2022-02-25 DIAGNOSIS — I3139 Other pericardial effusion (noninflammatory): Secondary | ICD-10-CM | POA: Diagnosis not present

## 2022-02-28 NOTE — Addendum Note (Signed)
Addended by: Pollyann Glen on: 02/28/2022 08:57 AM   Modules accepted: Orders

## 2022-03-01 ENCOUNTER — Other Ambulatory Visit (INDEPENDENT_AMBULATORY_CARE_PROVIDER_SITE_OTHER): Payer: 59

## 2022-03-01 ENCOUNTER — Other Ambulatory Visit (HOSPITAL_COMMUNITY): Payer: Self-pay

## 2022-03-01 ENCOUNTER — Other Ambulatory Visit: Payer: Self-pay | Admitting: Family Medicine

## 2022-03-01 DIAGNOSIS — E162 Hypoglycemia, unspecified: Secondary | ICD-10-CM

## 2022-03-01 LAB — HEMOGLOBIN A1C: Hgb A1c MFr Bld: 5.7 % (ref 4.6–6.5)

## 2022-03-01 MED ORDER — MELOXICAM 15 MG PO TABS
15.0000 mg | ORAL_TABLET | Freq: Every day | ORAL | 2 refills | Status: DC
Start: 2022-03-01 — End: 2022-03-24
  Filled 2022-03-01: qty 90, 90d supply, fill #0

## 2022-03-01 NOTE — Progress Notes (Signed)
After labs and ct will get labs and try meloxicam daily '15mg'$  for pericarditis

## 2022-03-02 LAB — INSULIN, RANDOM: Insulin: 2.7 u[IU]/mL

## 2022-03-14 ENCOUNTER — Other Ambulatory Visit (HOSPITAL_BASED_OUTPATIENT_CLINIC_OR_DEPARTMENT_OTHER): Payer: Self-pay | Admitting: Family Medicine

## 2022-03-14 DIAGNOSIS — Z1231 Encounter for screening mammogram for malignant neoplasm of breast: Secondary | ICD-10-CM

## 2022-03-16 ENCOUNTER — Other Ambulatory Visit: Payer: Self-pay | Admitting: Family Medicine

## 2022-03-16 DIAGNOSIS — R0781 Pleurodynia: Secondary | ICD-10-CM

## 2022-03-16 NOTE — Progress Notes (Signed)
Still having fatigue with an associated pleuritic chest pain seems to be worse with changing of positions.  CT scan (11/23) did show the patient did have a mild pericardial effusion.  Has responded very minorly to the meloxicam and awaiting echocardiogram but will put in referral for cardiology for further evaluation and workup.   Laboratory workup was fairly unremarkable.  Chest x-ray normal as well.  Highly active individual who usually works out on a daily basis and is still having difficulty doing anything more than daily activities.

## 2022-03-23 ENCOUNTER — Ambulatory Visit: Payer: 59 | Attending: Family Medicine

## 2022-03-23 DIAGNOSIS — R079 Chest pain, unspecified: Secondary | ICD-10-CM | POA: Insufficient documentation

## 2022-03-23 DIAGNOSIS — R072 Precordial pain: Secondary | ICD-10-CM | POA: Insufficient documentation

## 2022-03-23 LAB — ECHOCARDIOGRAM COMPLETE
Area-P 1/2: 3.06 cm2
S' Lateral: 2.9 cm

## 2022-03-23 NOTE — Progress Notes (Unsigned)
Cardiology Office Note   Date:  03/24/2022   ID:  Denell Cothern, DOB 08-14-1980, MRN [id number]  PCP:  Katherina Mires, MD  Cardiologist:   Minus Breeding, MD Referring:  Lyndal Pulley, DO  Chief Complaint  Patient presents with   Chest Pain      History of Present Illness: Chelsea Stephenson is a 41 y.o. female who presents for evaluation of chest pain.  The patient is very athletic.  She has been on Physiological scientist.  She works with Dr. Tamala Julian. He evaluated her positional chest pain.  CT demonstrated a small pericardial effusion.  This was without contrast. There were no other acute findings.    Echocardiogram done yesterday demonstrated NL EF.  There were no valvular abnormalities.    She was reporting symptoms that started around October.  She did workout quite intensely and then a few days later she started feeling chest discomfort.  She was dizzy and fatigued.  She had headache.  She felt like she was iron deficient so she took iron.  There was a chest x-ray which suggested some bronchial inflammation.  She took prednisone and antibiotics.  There was no significant help.  She took meloxicam.  There was eventually apparently resolution of findings on chest x-ray because the 1 chest x-ray ICU was unremarkable.  She had a CT of her chest to evaluate the pleuritic pain and there was the findings as above.  She describes the discomfort as left of sternum.  It is dull.  It is 3-4 in intensity.  It was worse in certain positions although was worse lying flat and may be better sitting forward.  It was not with exertion and she had worked limited at this time.  It would get worse after she was done which she was doing.  She is just felt uncomfortable about getting back to physical activity.  Prior to that she had no prior cardiac history and again exercised aggressively and routinely.  EKG done in early November was normal.  Inflammatory markers were normal.  Other labs unremarkable.     History  reviewed. No pertinent past medical history.  Past Surgical History:  Procedure Laterality Date   BUNIONECTOMY     SHOULDER SURGERY       Current Outpatient Medications  Medication Sig Dispense Refill   Ascorbic Acid (VITAMIN C) 500 MG CHEW      B Complex-Folic Acid (B-COMPLEX/AMINO ACID PO)      Ferrous Sulfate (IRON) 28 MG TABS      Multiple Vitamins-Minerals (MULTIVITAMIN ADULT PO) Take 1 tablet by mouth daily.     Vitamin D, Cholecalciferol, 1000 units CAPS Take 1 capsule by mouth daily.     Zinc 50 MG TABS      No current facility-administered medications for this visit.    Allergies:   Patient has no known allergies.    Social History:  The patient  reports that she has never smoked. She has never used smokeless tobacco. She reports current alcohol use. She reports that she does not use drugs.   Family History:  The patient's family history includes Cancer in her maternal grandmother; Heart disease in her maternal grandfather.   Her father does have a mildly enlarged aorta.   ROS:  Please see the history of present illness.   Otherwise, review of systems are positive for none.   All other systems are reviewed and negative.    PHYSICAL EXAM: VS:  BP 108/70  Pulse 95   Ht '5\' 10"'$  (1.778 m)   Wt 141 lb 3.2 oz (64 kg)   LMP 02/21/2022   SpO2 98%   BMI 20.26 kg/m  , BMI Body mass index is 20.26 kg/m. GENERAL:  Well appearing HEENT:  Pupils equal round and reactive, fundi not visualized, oral mucosa unremarkable NECK:  No jugular venous distention, waveform within normal limits, carotid upstroke brisk and symmetric, no bruits, no thyromegaly LYMPHATICS:  No cervical, inguinal adenopathy LUNGS:  Clear to auscultation bilaterally BACK:  No CVA tenderness CHEST:  Unremarkable HEART:  PMI not displaced or sustained,S1 and S2 within normal limits, no S3, no S4, no clicks, no rubs, no murmurs ABD:  Flat, positive bowel sounds normal in frequency in pitch, no bruits, no  rebound, no guarding, no midline pulsatile mass, no hepatomegaly, no splenomegaly EXT:  2 plus pulses throughout, no edema, no cyanosis no clubbing SKIN:  No rashes no nodules NEURO:  Cranial nerves II through XII grossly intact, motor grossly intact throughout PSYCH:  Cognitively intact, oriented to person place and time    EKG:  EKG is ordered today. The ekg ordered today demonstrates sinus rhythm, sinus tachycardia, early repolarization pattern, no acute ST-T wave changes.   Recent Labs: 02/14/2022: ALT 11; BUN 19; Creatinine, Ser 0.83; Hemoglobin 12.8; Platelets 242.0; Potassium 3.9; Sodium 138; TSH 1.96    Lipid Panel    Component Value Date/Time   CHOL 150 01/05/2018 0814   TRIG 41.0 01/05/2018 0814   HDL 55.60 01/05/2018 0814   CHOLHDL 3 01/05/2018 0814   VLDL 8.2 01/05/2018 0814   LDLCALC 86 01/05/2018 0814      Wt Readings from Last 3 Encounters:  03/24/22 141 lb 3.2 oz (64 kg)  01/05/18 143 lb 9.6 oz (65.1 kg)  08/11/16 141 lb 3.2 oz (64 kg)      Other studies Reviewed: Additional studies/ records that were reviewed today include: CT, labs, echo images reviewed. Review of the above records demonstrates:  Please see elsewhere in the note.     ASSESSMENT AND PLAN:  Chest pain:   I think she probably had some pleuritis or pericarditis.  I suspect that this has resolved.  I would not suggest further cardiovascular testing but of asked her to slowly get back to exercise.  She would let me know if this recurs and I might need to be further imaging although I am not at all suspecting myocarditis or continued pericarditis at this point.  Pericardial effusion :   This was not evident on  echo.   See above  Abnormal EKG: I reviewed this with the patient.  She had perfectly normal EKG when she was in sinus rhythm.  She has some repolarization abnormalities with tachycardia she is anxious today.  I have asked her to get a repeat EKG at work and share this with  me.  Current medicines are reviewed at length with the patient today.  The patient does not have concerns regarding medicines.  The following changes have been made:  no change  Labs/ tests ordered today include: None  Orders Placed This Encounter  Procedures   EKG 12-Lead     Disposition:   FU with me as needed.      Signed, Minus Breeding, MD  03/24/2022 2:09 PM    [place]

## 2022-03-24 ENCOUNTER — Ambulatory Visit (INDEPENDENT_AMBULATORY_CARE_PROVIDER_SITE_OTHER): Payer: 59 | Admitting: Cardiology

## 2022-03-24 ENCOUNTER — Encounter: Payer: Self-pay | Admitting: Cardiology

## 2022-03-24 VITALS — BP 108/70 | HR 95 | Ht 70.0 in | Wt 141.2 lb

## 2022-03-24 DIAGNOSIS — R072 Precordial pain: Secondary | ICD-10-CM | POA: Diagnosis not present

## 2022-03-24 NOTE — Patient Instructions (Signed)
Medication Instructions:  Your physician recommends that you continue on your current medications as directed. Please refer to the Current Medication list given to you today.  *If you need a refill on your cardiac medications before your next appointment, please call your pharmacy*   Lab Work: NONE If you have labs (blood work) drawn today and your tests are completely normal, you will receive your results only by: [place] (if you have MyChart) OR A paper copy in the mail If you have any lab test that is abnormal or we need to change your treatment, we will call you to review the results.   Testing/Procedures: NONE   Follow-Up: At [facility], you and your health needs are our priority.  As part of our continuing mission to provide you with exceptional heart care, we have created designated Provider Care Teams.  These Care Teams include your primary Cardiologist (physician) and Advanced Practice Providers (APPs -  Physician Assistants and Nurse Practitioners) who all work together to provide you with the care you need, when you need it.  We recommend signing up for the patient portal called "MyChart".  Sign up information is provided on this After Visit Summary.  MyChart is used to connect with patients for Virtual Visits (Telemedicine).  Patients are able to view lab/test results, encounter notes, upcoming appointments, etc.  Non-urgent messages can be sent to your provider as well.   To learn more about what you can do with MyChart, go to [email or web address].    Your next appointment:   AS NEEDED   The format for your next appointment:   In Person  Provider:   Minus Breeding, MD

## 2022-03-25 ENCOUNTER — Other Ambulatory Visit (HOSPITAL_COMMUNITY): Payer: 59

## 2022-03-28 ENCOUNTER — Ambulatory Visit (HOSPITAL_BASED_OUTPATIENT_CLINIC_OR_DEPARTMENT_OTHER)
Admission: RE | Admit: 2022-03-28 | Discharge: 2022-03-28 | Disposition: A | Discharge status: Home / Self Care | Payer: 59 | Source: Ambulatory Visit | Attending: Family Medicine | Admitting: Family Medicine

## 2022-03-28 DIAGNOSIS — Z1231 Encounter for screening mammogram for malignant neoplasm of breast: Secondary | ICD-10-CM | POA: Insufficient documentation

## 2022-03-31 ENCOUNTER — Other Ambulatory Visit: Payer: Self-pay

## 2022-03-31 DIAGNOSIS — R5383 Other fatigue: Secondary | ICD-10-CM

## 2022-03-31 DIAGNOSIS — R079 Chest pain, unspecified: Secondary | ICD-10-CM

## 2022-03-31 NOTE — Progress Notes (Signed)
Follow up EKG per dr Tamala Julian

## 2022-04-08 ENCOUNTER — Other Ambulatory Visit: Payer: Self-pay | Admitting: Family Medicine

## 2022-04-08 MED ORDER — CEPHALEXIN 500 MG PO CAPS: 500.0000 mg | ORAL_CAPSULE | Freq: Two times a day (BID) | ORAL | 0 refills | Status: DC

## 2022-04-08 NOTE — Progress Notes (Signed)
UTi symptoms, patient is traveling, sent in antibiottic

## 2022-06-28 ENCOUNTER — Other Ambulatory Visit (INDEPENDENT_AMBULATORY_CARE_PROVIDER_SITE_OTHER): Payer: 59

## 2022-06-28 ENCOUNTER — Other Ambulatory Visit (HOSPITAL_COMMUNITY): Payer: Self-pay

## 2022-06-28 ENCOUNTER — Other Ambulatory Visit: Payer: Self-pay | Admitting: Family Medicine

## 2022-06-28 DIAGNOSIS — N730 Acute parametritis and pelvic cellulitis: Secondary | ICD-10-CM

## 2022-06-28 LAB — COMPREHENSIVE METABOLIC PANEL
ALT: 12 U/L (ref 0–35)
AST: 18 U/L (ref 0–37)
Albumin: 4 g/dL (ref 3.5–5.2)
Alkaline Phosphatase: 48 U/L (ref 39–117)
BUN: 17 mg/dL (ref 6–23)
CO2: 24 mEq/L (ref 19–32)
Calcium: 9.3 mg/dL (ref 8.4–10.5)
Chloride: 106 mEq/L (ref 96–112)
Creatinine, Ser: 1 mg/dL (ref 0.40–1.20)
GFR: 69.07 mL/min (ref >60.00)
Glucose, Bld: 88 mg/dL (ref 70–99)
Potassium: 4.1 mEq/L (ref 3.5–5.1)
Sodium: 138 mEq/L (ref 135–145)
Total Bilirubin: 0.5 mg/dL (ref 0.2–1.2)
Total Protein: 6.7 g/dL (ref 6.0–8.3)

## 2022-06-28 LAB — CBC WITH DIFFERENTIAL/PLATELET
Basophils Absolute: 0 10*3/uL (ref 0.0–0.1)
Basophils Relative: 1 % (ref 0.0–3.0)
Eosinophils Absolute: 0.1 10*3/uL (ref 0.0–0.7)
Eosinophils Relative: 1.8 % (ref 0.0–5.0)
HCT: 34.7 % — ABNORMAL LOW (ref 36.0–46.0)
Hemoglobin: 11.9 g/dL — ABNORMAL LOW (ref 12.0–15.0)
Lymphocytes Relative: 21 % (ref 12.0–46.0)
Lymphs Abs: 0.8 10*3/uL (ref 0.7–4.0)
MCHC: 34.3 g/dL (ref 30.0–36.0)
MCV: 95 fl (ref 78.0–100.0)
Monocytes Absolute: 0.3 10*3/uL (ref 0.1–1.0)
Monocytes Relative: 9.3 % (ref 3.0–12.0)
Neutro Abs: 2.4 10*3/uL (ref 1.4–7.7)
Neutrophils Relative %: 66.9 % (ref 43.0–77.0)
Platelets: 215 10*3/uL (ref 150.0–400.0)
RBC: 3.65 Mil/uL — ABNORMAL LOW (ref 3.87–5.11)
RDW: 12.6 % (ref 11.5–15.5)
WBC: 3.6 10*3/uL — ABNORMAL LOW (ref 4.0–10.5)

## 2022-06-28 LAB — URINALYSIS, ROUTINE W REFLEX MICROSCOPIC
Bilirubin Urine: NEGATIVE
Ketones, ur: NEGATIVE
Leukocytes,Ua: NEGATIVE
Specific Gravity, Urine: 1.01 (ref 1.000–1.030)
Total Protein, Urine: NEGATIVE
Urine Glucose: NEGATIVE
Urobilinogen, UA: 0.2 (ref 0.0–1.0)
pH: 6 (ref 5.0–8.0)

## 2022-06-28 MED ORDER — CEFTRIAXONE SODIUM 1 G IJ SOLR
1.0000 g | Freq: Once | INTRAMUSCULAR | Status: AC
  Administered 2022-06-28: 1 g via INTRAMUSCULAR

## 2022-06-28 MED ORDER — KETOROLAC TROMETHAMINE 60 MG/2ML IM SOLN
60.0000 mg | Freq: Once | INTRAMUSCULAR | Status: AC
  Administered 2022-06-28: 60 mg via INTRAMUSCULAR

## 2022-06-28 MED ORDER — DOXYCYCLINE HYCLATE 100 MG PO TABS
100.0000 mg | ORAL_TABLET | Freq: Two times a day (BID) | ORAL | 0 refills | Status: AC
  Filled 2022-06-28: qty 28, 14d supply, fill #0

## 2022-06-28 MED ORDER — METRONIDAZOLE 500 MG PO TABS
500.0000 mg | ORAL_TABLET | Freq: Two times a day (BID) | ORAL | 0 refills | Status: AC
  Filled 2022-06-28: qty 28, 14d supply, fill #0

## 2022-06-28 NOTE — Progress Notes (Signed)
5 day history of worsening pelvic and abdominal pain after menstruation.  No discharge, no fever but he worsening abdominal pain that seems better with movement.  No colicky feelings but does have some mild right lower back pain as well.  Concern based on timing is for pelvic inflammatory disease.  Will treat with ceftriaxone 1 x 1 IM as well as doxycycline and Flagyl.  Labs also ordered to make sure that there is no systemic infection that would be concerning.  Patient will follow-up with me or primary care if necessary.

## 2022-10-05 ENCOUNTER — Other Ambulatory Visit: Payer: Self-pay | Admitting: Family Medicine

## 2022-10-05 ENCOUNTER — Other Ambulatory Visit (HOSPITAL_COMMUNITY): Payer: Self-pay

## 2022-10-05 DIAGNOSIS — F419 Anxiety disorder, unspecified: Secondary | ICD-10-CM

## 2022-10-05 MED ORDER — VENLAFAXINE HCL ER 37.5 MG PO CP24
37.5000 mg | ORAL_CAPSULE | Freq: Every day | ORAL | 1 refills | Status: DC
  Filled 2022-10-05: qty 30, 30d supply, fill #0

## 2022-10-05 NOTE — Progress Notes (Signed)
Patient recently has had some increasing in some anxiety.  Having obsessive thought process.  Questionable mild depression but nothing significant.  Denies any homicidal or suicidal ideation.  Do feel that the underlying anxiety is worsening.  Has tried Lexapro previously with no significant improvement and worsening sexual dysfunction.  Will start Effexor.  Warned of potential side effects.  Follow-up again 1 month if necessary.

## 2022-10-17 ENCOUNTER — Other Ambulatory Visit (HOSPITAL_COMMUNITY): Payer: Self-pay

## 2022-10-17 ENCOUNTER — Other Ambulatory Visit: Payer: Self-pay | Admitting: Family Medicine

## 2022-10-17 MED ORDER — DULOXETINE HCL 20 MG PO CPEP
20.0000 mg | ORAL_CAPSULE | Freq: Every day | ORAL | 3 refills | Status: DC
  Filled 2022-10-17: qty 30, 30d supply, fill #0

## 2022-10-17 NOTE — Progress Notes (Signed)
Had side effect of dizziness to effexor, discontinue and try cymbalta

## 2022-11-10 ENCOUNTER — Other Ambulatory Visit: Payer: Self-pay | Admitting: Family Medicine

## 2022-11-10 ENCOUNTER — Other Ambulatory Visit (HOSPITAL_COMMUNITY): Payer: Self-pay

## 2022-11-10 MED ORDER — DULOXETINE HCL 20 MG PO CPEP
20.0000 mg | ORAL_CAPSULE | Freq: Every day | ORAL | 3 refills | Status: DC
  Filled 2022-11-10: qty 90, 90d supply, fill #0
  Filled 2023-02-06: qty 90, 90d supply, fill #1

## 2022-11-10 NOTE — Progress Notes (Signed)
90 day supply

## 2023-01-24 DIAGNOSIS — L814 Other melanin hyperpigmentation: Secondary | ICD-10-CM | POA: Diagnosis not present

## 2023-01-24 DIAGNOSIS — D225 Melanocytic nevi of trunk: Secondary | ICD-10-CM | POA: Diagnosis not present

## 2023-01-24 DIAGNOSIS — D2272 Melanocytic nevi of left lower limb, including hip: Secondary | ICD-10-CM | POA: Diagnosis not present

## 2023-01-24 DIAGNOSIS — L578 Other skin changes due to chronic exposure to nonionizing radiation: Secondary | ICD-10-CM | POA: Diagnosis not present

## 2023-02-06 ENCOUNTER — Other Ambulatory Visit (HOSPITAL_COMMUNITY): Payer: Self-pay

## 2023-02-27 ENCOUNTER — Other Ambulatory Visit: Payer: Self-pay | Admitting: Family Medicine

## 2023-02-27 ENCOUNTER — Other Ambulatory Visit (HOSPITAL_COMMUNITY): Payer: Self-pay

## 2023-02-27 MED ORDER — AZITHROMYCIN 500 MG PO TABS
500.0000 mg | ORAL_TABLET | Freq: Every day | ORAL | 0 refills | Status: DC
  Filled 2023-02-27: qty 5, 5d supply, fill #0

## 2023-02-27 MED ORDER — ALBUTEROL SULFATE HFA 108 (90 BASE) MCG/ACT IN AERS
2.0000 | INHALATION_SPRAY | RESPIRATORY_TRACT | 6 refills | Status: DC | PRN
  Filled 2023-02-27: qty 6.7, 16d supply, fill #0

## 2023-02-27 NOTE — Progress Notes (Signed)
Patient is a very healthy 42 year old female who unfortunately has had a chronic cough going on greater than 5 weeks at this time.  Patient's oxygen saturation is 98% respiration rate of 12 noted.  Afebrile at this point but minor increase in lethargy and unable to work out as much secondary to the cough also will send in albuterol in case to try 30 minutes before activities.  Denies any lower extremity swelling, no recent travel history, has been ill and been around kids who have also been ill recently.  Will hold on the x-ray but with surrounding community acquired atypical pneumonia will treat accordingly and will start azithromycin.  If continue to have difficulty will discuss the possibility of chest x-ray and further evaluation by pulmonology

## 2023-03-01 ENCOUNTER — Other Ambulatory Visit (HOSPITAL_BASED_OUTPATIENT_CLINIC_OR_DEPARTMENT_OTHER): Payer: Self-pay

## 2023-05-09 ENCOUNTER — Other Ambulatory Visit (HOSPITAL_COMMUNITY): Payer: Self-pay

## 2023-05-09 ENCOUNTER — Other Ambulatory Visit: Payer: Self-pay | Admitting: Family Medicine

## 2023-05-09 MED ORDER — DULOXETINE HCL 20 MG PO CPEP
20.0000 mg | ORAL_CAPSULE | Freq: Every day | ORAL | 3 refills | Status: DC
  Filled 2023-05-09: qty 90, 90d supply, fill #0
  Filled 2023-08-02: qty 90, 90d supply, fill #1
  Filled 2023-10-30: qty 90, 90d supply, fill #2
  Filled 2024-01-27: qty 90, 90d supply, fill #3

## 2023-05-09 NOTE — Progress Notes (Signed)
Need refill, doing well, no side effects

## 2023-06-05 ENCOUNTER — Encounter (HOSPITAL_BASED_OUTPATIENT_CLINIC_OR_DEPARTMENT_OTHER): Payer: Self-pay | Admitting: Radiology

## 2023-06-05 ENCOUNTER — Ambulatory Visit (HOSPITAL_BASED_OUTPATIENT_CLINIC_OR_DEPARTMENT_OTHER)
Admission: RE | Admit: 2023-06-05 | Discharge: 2023-06-05 | Disposition: A | Discharge status: Home / Self Care | Payer: 59 | Source: Ambulatory Visit

## 2023-06-05 DIAGNOSIS — Z1231 Encounter for screening mammogram for malignant neoplasm of breast: Secondary | ICD-10-CM | POA: Diagnosis not present

## 2023-08-02 ENCOUNTER — Other Ambulatory Visit (HOSPITAL_COMMUNITY): Payer: Self-pay

## 2023-08-08 IMAGING — MG MM DIGITAL SCREENING BILAT W/ TOMO AND CAD
8 series · 9 of 24 positions shown · non-contrast
Comparison: Previous exam(s).

CLINICAL DATA: Screening.

EXAM:
DIGITAL SCREENING BILATERAL MAMMOGRAM WITH TOMOSYNTHESIS AND CAD
TECHNIQUE: Bilateral screening digital craniocaudal and mediolateral oblique
mammograms were obtained. Bilateral screening digital breast
tomosynthesis was performed. The images were evaluated with
computer-aided detection.

[L CC synth-2D]
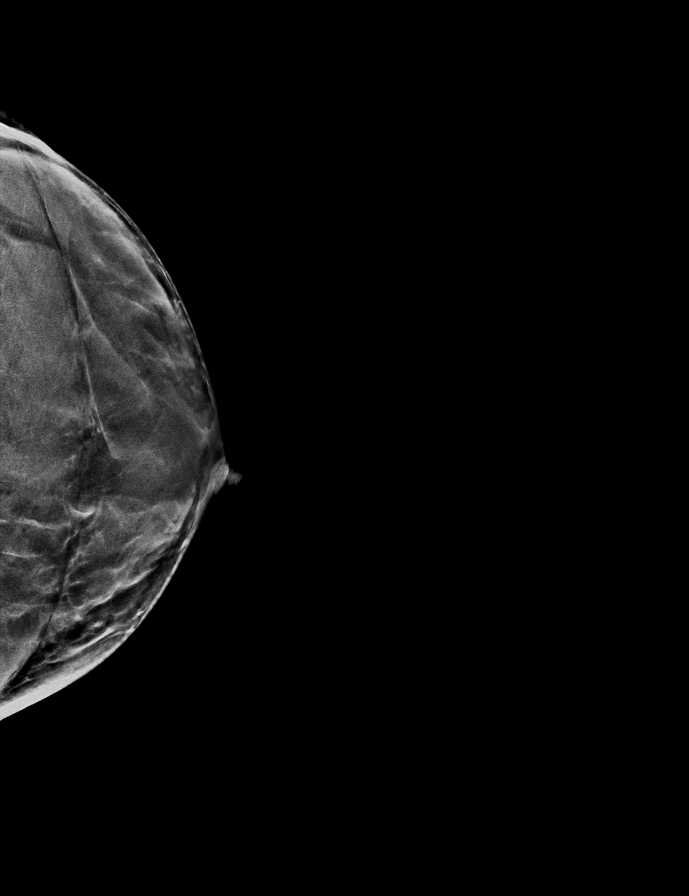

[R MLO synth-2D]
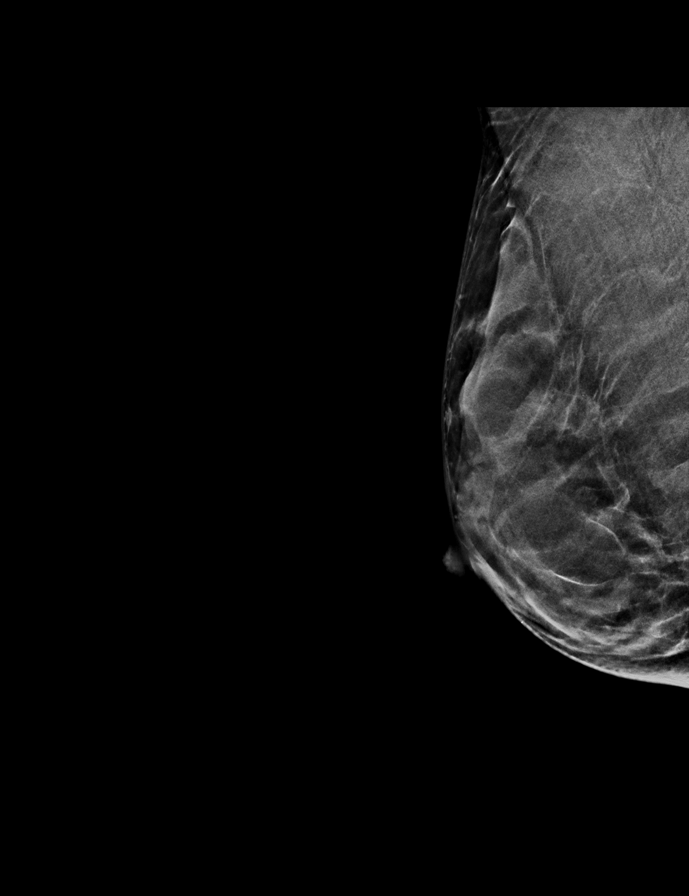

[L MLO synth-2D]
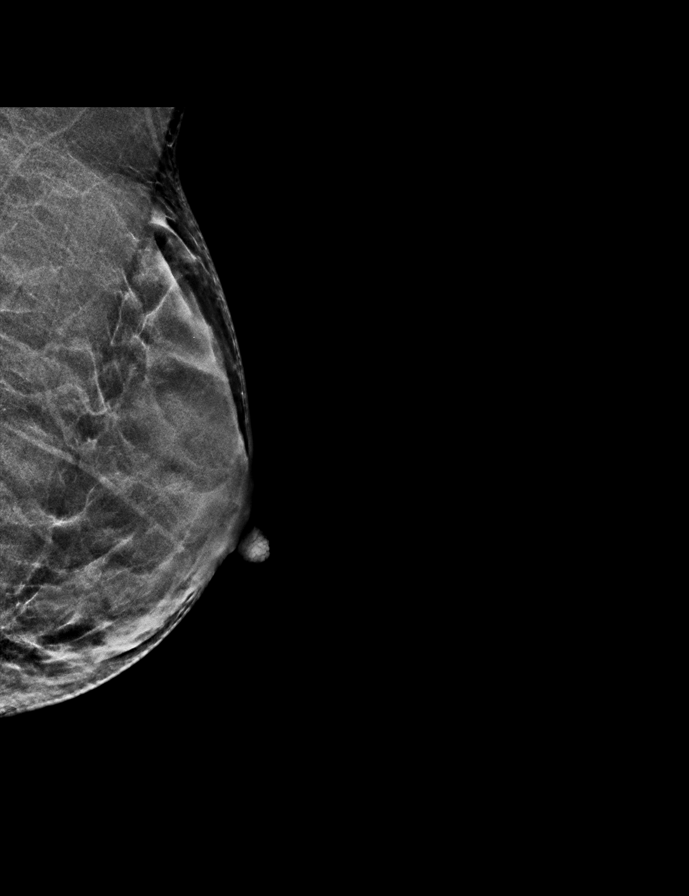

[R CC synth-2D]
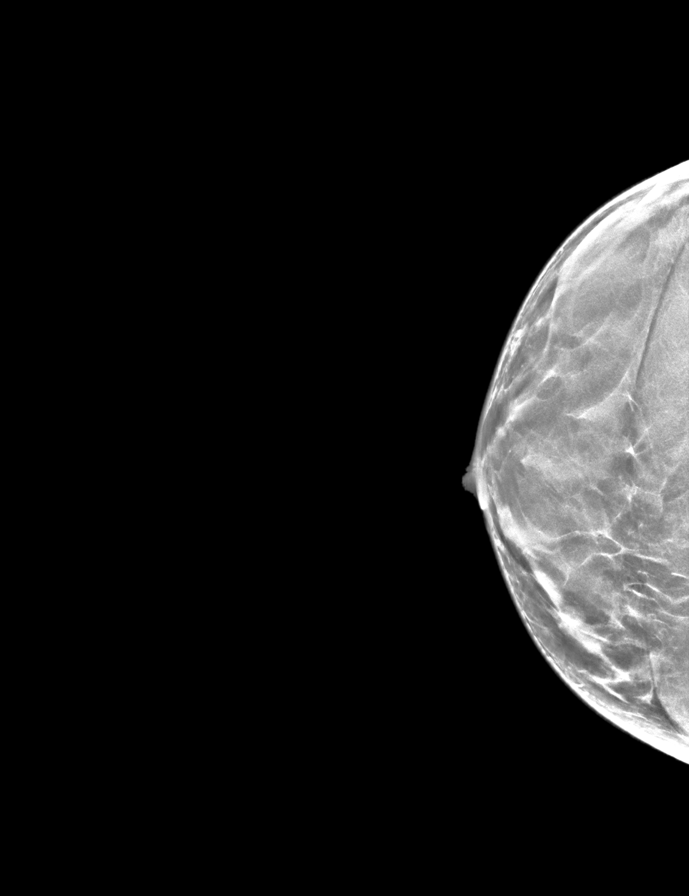

[R MLO tomo · 2 of 41 frames shown]
[frame 14/41]
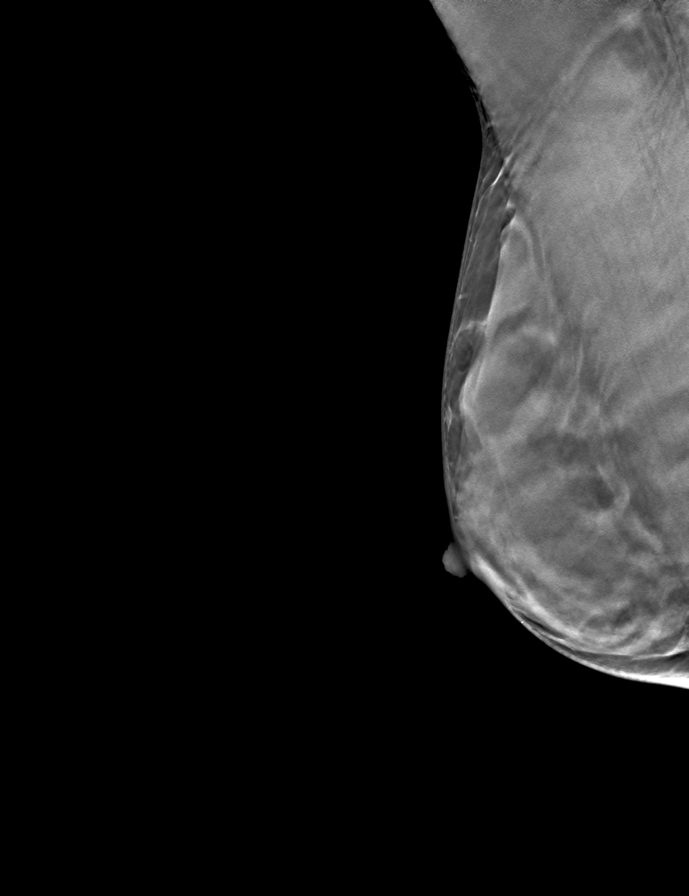
[frame 21/41]
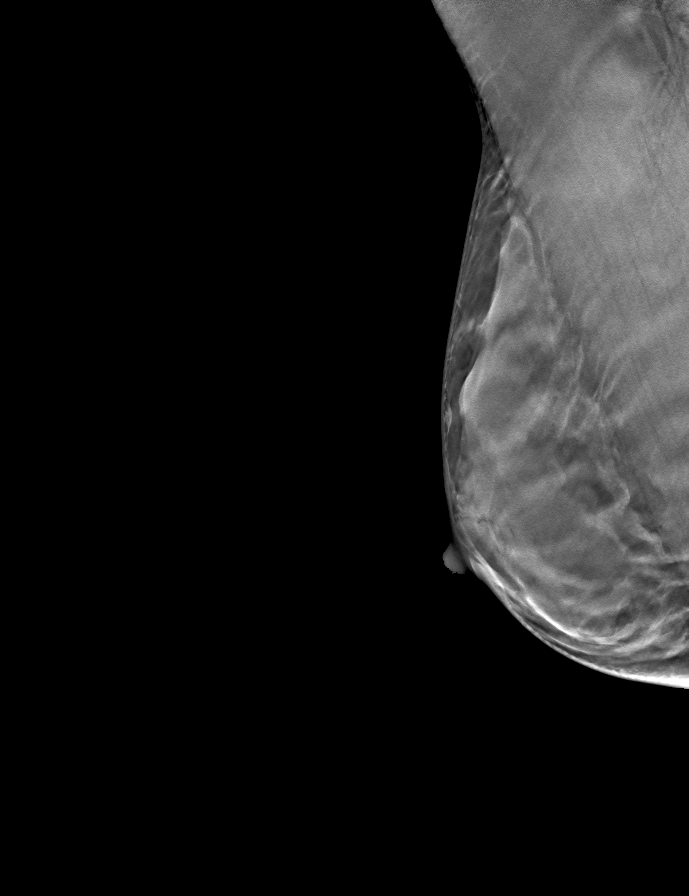

[L CC tomo · tomo slice 24/47.0]
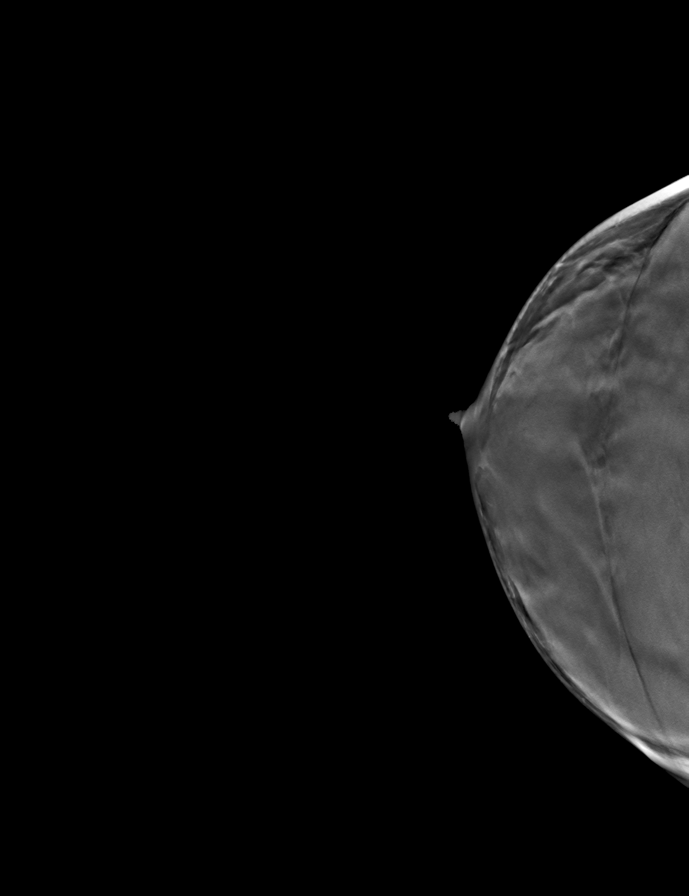

[R CC tomo · tomo slice 24/47.0]
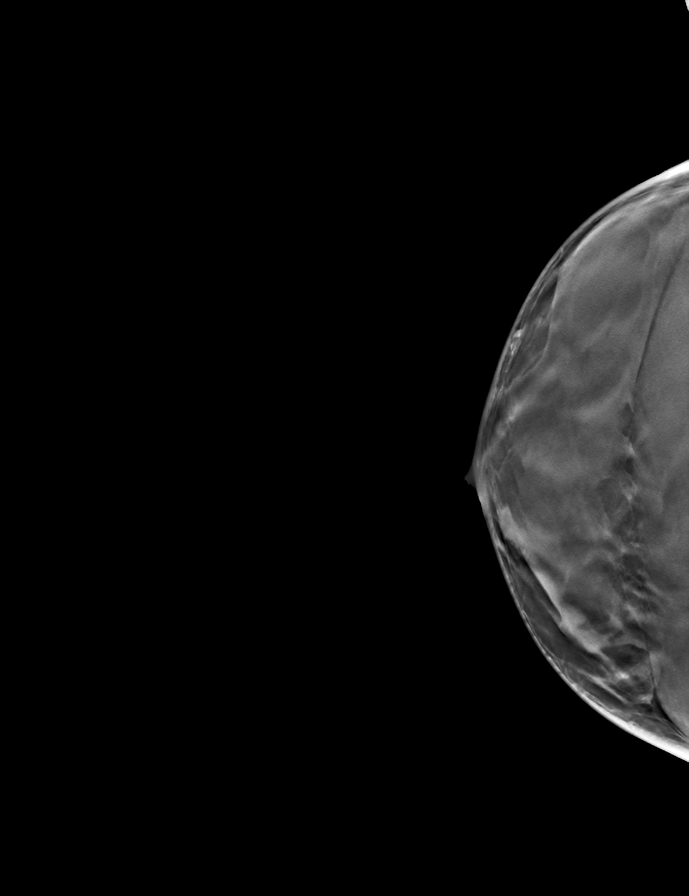

[L MLO tomo · tomo slice 20/39.0]
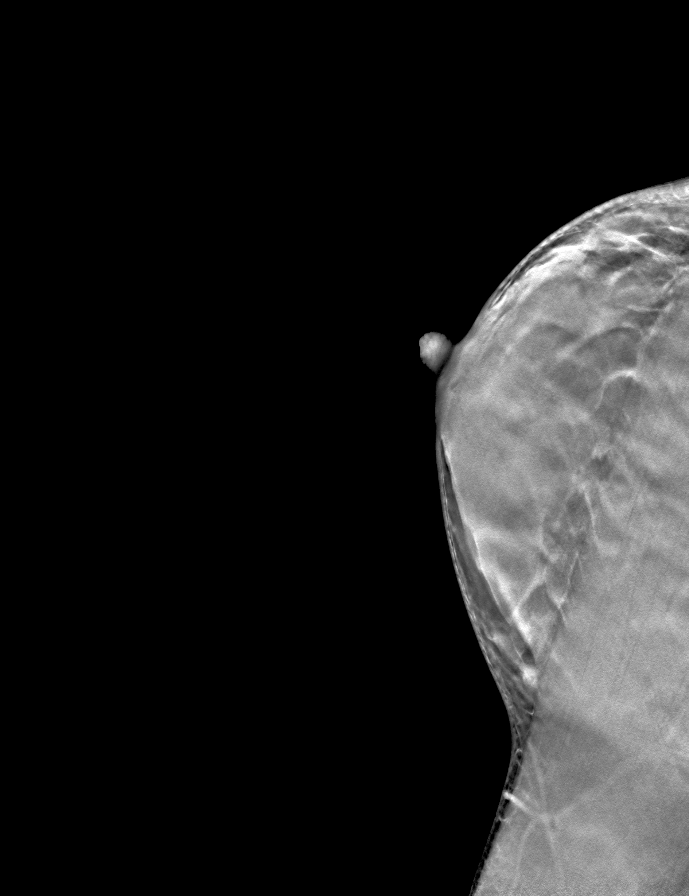

[9 of 24 positions shown; findings below may reference images not displayed]

ACR Breast Density Category d: The breast tissue is extremely dense,
which lowers the sensitivity of mammography
FINDINGS: There are no findings suspicious for malignancy.
IMPRESSION: No mammographic evidence of malignancy. A result letter of this
screening mammogram will be mailed directly to the patient.

RECOMMENDATION:
Screening mammogram in one year. (Code:TA-V-WV9)

BI-RADS CATEGORY  1: Negative.

## 2023-08-27 ENCOUNTER — Encounter: Payer: Self-pay | Admitting: Internal Medicine

## 2023-08-27 NOTE — Progress Notes (Unsigned)
 Subjective:    Patient ID: Chelsea Stephenson, female    DOB: [birth year], 43 y.o.   MRN: [id number]      HPI Chelsea Stephenson is here for a Physical exam and her chronic medical problems.    Blood in urine-2 episodes the past couple of months.  The episodes would last couple of days and was dark brown in color.  It did not occur when she had her menses.  It was not just after intense exercise.  No dysuria, increasing urinary frequency or urgency at that time.  GI issues x few years - more often stools that are soft.  She may have multiple bowel movements a day that are soft stools, but not necessarily complete stools.  She does have some associated abdominal pain.  She eats very healthy-not a lot of carbs and a lot of fiber.  No major changes in how she is eating.  No changes in medications or supplements that would have caused change in bowel habits.  Her menstrual cycle is coming earlier-every 18-20 days and still lasting about 6 days.  First couple of days are heavy and then less heavy-that has not changed.   Medications and allergies reviewed with patient and updated if appropriate.  Current Outpatient Medications on File Prior to Visit  Medication Sig Dispense Refill   Ascorbic Acid (VITAMIN C) 500 MG CHEW      B Complex-Folic Acid (B-COMPLEX/AMINO ACID PO)      DULoxetine  (CYMBALTA ) 20 MG capsule Take 1 capsule (20 mg total) by mouth daily. 90 capsule 3   Ferrous Sulfate (IRON) 28 MG TABS      Vitamin D , Cholecalciferol, 1000 units CAPS Take 1 capsule by mouth daily.     Zinc 50 MG TABS      No current facility-administered medications on file prior to visit.    Review of Systems  Constitutional:  Negative for fever.  Eyes:  Negative for visual disturbance.  Respiratory:  Negative for cough, shortness of breath and wheezing.   Cardiovascular:  Negative for chest pain, palpitations and leg swelling.  Gastrointestinal:  Positive for abdominal pain (cramping - menses and bowels (  sometimes better after bowel - comes back)). Negative for blood in stool, constipation, diarrhea and nausea.       No gerd  Genitourinary:  Positive for frequency and hematuria (2 episodes - lasted 2 days). Negative for dysuria and urgency.  Musculoskeletal:  Negative for arthralgias and back pain.  Skin:  Negative for rash.  Neurological:  Negative for light-headedness and headaches.  Psychiatric/Behavioral:  Negative for dysphoric mood. The patient is nervous/anxious.        Objective:   Vitals:   08/28/23 1343  BP: 108/68  Pulse: 98  Temp: 98.6 F (37 C)  SpO2: 99%   Filed Weights   08/28/23 1343  Weight: 144 lb (65.3 kg)   Body mass index is 20.66 kg/m.  BP Readings from Last 3 Encounters:  08/28/23 108/68  03/24/22 108/70  02/24/22 110/80    Wt Readings from Last 3 Encounters:  08/28/23 144 lb (65.3 kg)  03/24/22 141 lb 3.2 oz (64 kg)  01/05/18 143 lb 9.6 oz (65.1 kg)       Physical Exam Constitutional: She appears well-developed and well-nourished. No distress.  HENT:  Head: Normocephalic and atraumatic.  Right Ear: External ear normal. Normal ear canal and TM Left Ear: External ear normal.  Normal ear canal and TM Mouth/Throat: Oropharynx is clear and moist.  Eyes: Conjunctivae normal.  Neck: Neck supple. No tracheal deviation present. No thyromegaly present.  No carotid bruit  Cardiovascular: Normal rate, regular rhythm and normal heart sounds.   No murmur heard.  No edema. Pulmonary/Chest: Effort normal and breath sounds normal. No respiratory distress. She has no wheezes. She has no rales.  Breast: deferred   Abdominal: Soft. She exhibits no distension. There is no tenderness.  Lymphadenopathy: She has no cervical adenopathy.  Skin: Skin is warm and dry. She is not diaphoretic.  Psychiatric: She has a normal mood and affect. Her behavior is normal.     Lab Results  Component Value Date   WBC 3.6 (L) 06/28/2022   HGB 11.9 (L) 06/28/2022   HCT  34.7 (L) 06/28/2022   PLT 215.0 06/28/2022   GLUCOSE 88 06/28/2022   CHOL 150 01/05/2018   TRIG 41.0 01/05/2018   HDL 55.60 01/05/2018   LDLCALC 86 01/05/2018   ALT 12 06/28/2022   AST 18 06/28/2022   NA 138 06/28/2022   K 4.1 06/28/2022   CL 106 06/28/2022   CREATININE 1.00 06/28/2022   BUN 17 06/28/2022   CO2 24 06/28/2022   TSH 1.96 02/14/2022   HGBA1C 5.7 03/01/2022         Assessment & Plan:   Physical exam: Screening blood work  ordered Exercise  regular Weight  normal Substance abuse  none   Reviewed recommended immunizations.   Health Maintenance  Topic Date Due   Hepatitis C Screening  Never done   Cervical Cancer Screening (HPV/Pap Cotest)  05/25/2020   COVID-19 Vaccine (1 - 2024-25 season) Never done   INFLUENZA VACCINE  11/17/2023   DTaP/Tdap/Td (3 - Td or Tdap) 05/25/2025   HIV Screening  Completed   HPV VACCINES  Aged Out   Meningococcal B Vaccine  Aged Out      Will establish with gynecology    See Problem List for Assessment and Plan of chronic medical problems.

## 2023-08-27 NOTE — Patient Instructions (Addendum)
 Schedule with a gynecologist    Blood work was ordered.       Medications changes include :   None    A referral was ordered urology and someone will call you to schedule an appointment.     Return in about 1 year (around 08/27/2024) for Physical Exam.   Health Maintenance, Female Adopting a healthy lifestyle and getting preventive care are important in promoting health and wellness. Ask your health care provider about: The right schedule for you to have regular tests and exams. Things you can do on your own to prevent diseases and keep yourself healthy. What should I know about diet, weight, and exercise? Eat a healthy diet  Eat a diet that includes plenty of vegetables, fruits, low-fat dairy products, and lean protein. Do not eat a lot of foods that are high in solid fats, added sugars, or sodium. Maintain a healthy weight Body mass index (BMI) is used to identify weight problems. It estimates body fat based on height and weight. Your health care provider can help determine your BMI and help you achieve or maintain a healthy weight. Get regular exercise Get regular exercise. This is one of the most important things you can do for your health. Most adults should: Exercise for at least 150 minutes each week. The exercise should increase your heart rate and make you sweat (moderate-intensity exercise). Do strengthening exercises at least twice a week. This is in addition to the moderate-intensity exercise. Spend less time sitting. Even light physical activity can be beneficial. Watch cholesterol and blood lipids Have your blood tested for lipids and cholesterol at 45 years of age, then have this test every 5 years. Have your cholesterol levels checked more often if: Your lipid or cholesterol levels are high. You are older than 45 years of age. You are at high risk for heart disease. What should I know about cancer screening? Depending on your health history and family  history, you may need to have cancer screening at various ages. This may include screening for: Breast cancer. Cervical cancer. Colorectal cancer. Skin cancer. Lung cancer. What should I know about heart disease, diabetes, and high blood pressure? Blood pressure and heart disease High blood pressure causes heart disease and increases the risk of stroke. This is more likely to develop in people who have high blood pressure readings or are overweight. Have your blood pressure checked: Every 3-5 years if you are 25-24 years of age. Every year if you are 19 years old or older. Diabetes Have regular diabetes screenings. This checks your fasting blood sugar level. Have the screening done: Once every three years after age 40 if you are at a normal weight and have a low risk for diabetes. More often and at a younger age if you are overweight or have a high risk for diabetes. What should I know about preventing infection? Hepatitis B If you have a higher risk for hepatitis B, you should be screened for this virus. Talk with your health care provider to find out if you are at risk for hepatitis B infection. Hepatitis C Testing is recommended for: Everyone born from [birth year] through 1967. Anyone with known risk factors for hepatitis C. Sexually transmitted infections (STIs) Get screened for STIs, including gonorrhea and chlamydia, if: You are sexually active and are younger than 45 years of age. You are older than 45 years of age and your health care provider tells you that you are at risk for this type  of infection. Your sexual activity has changed since you were last screened, and you are at increased risk for chlamydia or gonorrhea. Ask your health care provider if you are at risk. Ask your health care provider about whether you are at high risk for HIV. Your health care provider may recommend a prescription medicine to help prevent HIV infection. If you choose to take medicine to prevent HIV, you  should first get tested for HIV. You should then be tested every 3 months for as long as you are taking the medicine. Pregnancy If you are about to stop having your period (premenopausal) and you may become pregnant, seek counseling before you get pregnant. Take 400 to 800 micrograms (mcg) of folic acid every day if you become pregnant. Ask for birth control (contraception) if you want to prevent pregnancy. Osteoporosis and menopause Osteoporosis is a disease in which the bones lose minerals and strength with aging. This can result in bone fractures. If you are 60 years old or older, or if you are at risk for osteoporosis and fractures, ask your health care provider if you should: Be screened for bone loss. Take a calcium or vitamin D  supplement to lower your risk of fractures. Be given hormone replacement therapy (HRT) to treat symptoms of menopause. Follow these instructions at home: Alcohol use Do not drink alcohol if: Your health care provider tells you not to drink. You are pregnant, may be pregnant, or are planning to become pregnant. If you drink alcohol: Limit how much you have to: 0-1 drink a day. Know how much alcohol is in your drink. In the [place] one drink equals one 12 oz bottle of beer (355 mL), one 5 oz glass of wine (148 mL), or one 1 oz glass of hard liquor (44 mL). Lifestyle Do not use any products that contain nicotine or tobacco. These products include cigarettes, chewing tobacco, and vaping devices, such as e-cigarettes. If you need help quitting, ask your health care provider. Do not use street drugs. Do not share needles. Ask your health care provider for help if you need support or information about quitting drugs. General instructions Schedule regular health, dental, and eye exams. Stay current with your vaccines. Tell your health care provider if: You often feel depressed. You have ever been abused or do not feel safe at home. Summary Adopting a healthy  lifestyle and getting preventive care are important in promoting health and wellness. Follow your health care provider's instructions about healthy diet, exercising, and getting tested or screened for diseases. Follow your health care provider's instructions on monitoring your cholesterol and blood pressure. This information is not intended to replace advice given to you by your health care provider. Make sure you discuss any questions you have with your health care provider. Document Revised: 08/24/2020 Document Reviewed: 08/24/2020 Elsevier Patient Education  2024 [employer].

## 2023-08-28 ENCOUNTER — Ambulatory Visit: Admitting: Internal Medicine

## 2023-08-28 VITALS — BP 108/68 | HR 98 | Temp 98.6°F | Ht 70.0 in | Wt 144.0 lb

## 2023-08-28 DIAGNOSIS — N920 Excessive and frequent menstruation with regular cycle: Secondary | ICD-10-CM | POA: Insufficient documentation

## 2023-08-28 DIAGNOSIS — Z0000 Encounter for general adult medical examination without abnormal findings: Secondary | ICD-10-CM

## 2023-08-28 DIAGNOSIS — R310 Gross hematuria: Secondary | ICD-10-CM

## 2023-08-28 DIAGNOSIS — R194 Change in bowel habit: Secondary | ICD-10-CM

## 2023-08-28 DIAGNOSIS — R319 Hematuria, unspecified: Secondary | ICD-10-CM

## 2023-08-28 DIAGNOSIS — D649 Anemia, unspecified: Secondary | ICD-10-CM

## 2023-08-28 NOTE — Assessment & Plan Note (Signed)
 New For years she has had some GI issues but has gotten worse recently Frequent soft stools-can have several stools a day Has associated abdominal pain/cramping No blood in the stool, weight stable Not related to any specific food No family history GI issues ?  Related to change in menstrual cycle May need to see GI, but will hold off for right now-will be due for colonoscopy later this year Check labs including TSH

## 2023-08-28 NOTE — Assessment & Plan Note (Signed)
 2 episodes in the past 2 months Lasted a couple of days Blood was dark brown in color Did not follow an intense workout Not related to menstrual cycle No symptoms consistent with UTI UA, urine culture Needs to see urology-will hold off on CT scan until she sees urology

## 2023-08-29 ENCOUNTER — Ambulatory Visit: Payer: Self-pay | Admitting: Internal Medicine

## 2023-08-29 DIAGNOSIS — R319 Hematuria, unspecified: Secondary | ICD-10-CM | POA: Diagnosis not present

## 2023-08-29 LAB — CBC WITH DIFFERENTIAL/PLATELET
Basophils Absolute: 0.1 10*3/uL (ref 0.0–0.1)
Basophils Relative: 1.2 % (ref 0.0–3.0)
Eosinophils Absolute: 0.1 10*3/uL (ref 0.0–0.7)
Eosinophils Relative: 2.5 % (ref 0.0–5.0)
HCT: 38.5 % (ref 36.0–46.0)
Hemoglobin: 12.7 g/dL (ref 12.0–15.0)
Lymphocytes Relative: 33.7 % (ref 12.0–46.0)
Lymphs Abs: 1.6 10*3/uL (ref 0.7–4.0)
MCHC: 33 g/dL (ref 30.0–36.0)
MCV: 94.9 fl (ref 78.0–100.0)
Monocytes Absolute: 0.5 10*3/uL (ref 0.1–1.0)
Monocytes Relative: 10 % (ref 3.0–12.0)
Neutro Abs: 2.4 10*3/uL (ref 1.4–7.7)
Neutrophils Relative %: 52.6 % (ref 43.0–77.0)
Platelets: 254 10*3/uL (ref 150.0–400.0)
RBC: 4.06 Mil/uL (ref 3.87–5.11)
RDW: 13.3 % (ref 11.5–15.5)
WBC: 4.6 10*3/uL (ref 4.0–10.5)

## 2023-08-29 LAB — URINALYSIS, ROUTINE W REFLEX MICROSCOPIC
Bilirubin Urine: NEGATIVE
Hgb urine dipstick: NEGATIVE
Ketones, ur: NEGATIVE
Leukocytes,Ua: NEGATIVE
Nitrite: NEGATIVE
RBC / HPF: NONE SEEN (ref 0–?)
Specific Gravity, Urine: <=1.005 — AB (ref 1.000–1.030)
Total Protein, Urine: NEGATIVE
Urine Glucose: NEGATIVE
Urobilinogen, UA: 0.2 (ref 0.0–1.0)
WBC, UA: NONE SEEN (ref 0–?)
pH: 6 (ref 5.0–8.0)

## 2023-08-29 LAB — COMPREHENSIVE METABOLIC PANEL WITH GFR
ALT: 12 U/L (ref 0–35)
AST: 20 U/L (ref 0–37)
Albumin: 4.5 g/dL (ref 3.5–5.2)
Alkaline Phosphatase: 41 U/L (ref 39–117)
BUN: 14 mg/dL (ref 6–23)
CO2: 28 meq/L (ref 19–32)
Calcium: 9.4 mg/dL (ref 8.4–10.5)
Chloride: 104 meq/L (ref 96–112)
Creatinine, Ser: 0.82 mg/dL (ref 0.40–1.20)
GFR: 86.93 mL/min (ref >60.00)
Glucose, Bld: 88 mg/dL (ref 70–99)
Potassium: 3.7 meq/L (ref 3.5–5.1)
Sodium: 140 meq/L (ref 135–145)
Total Bilirubin: 0.6 mg/dL (ref 0.2–1.2)
Total Protein: 7.5 g/dL (ref 6.0–8.3)

## 2023-08-29 LAB — IBC PANEL
Iron: 130 ug/dL (ref 42–145)
Saturation Ratios: 45.1 % (ref 20.0–50.0)
TIBC: 288.4 ug/dL (ref 250.0–450.0)
Transferrin: 206 mg/dL — ABNORMAL LOW (ref 212.0–360.0)

## 2023-08-29 LAB — LIPID PANEL
Cholesterol: 173 mg/dL (ref 0–200)
HDL: 67.3 mg/dL (ref >39.00)
LDL Cholesterol: 96 mg/dL (ref 0–99)
NonHDL: 105.77
Total CHOL/HDL Ratio: 3
Triglycerides: 47 mg/dL (ref 0.0–149.0)
VLDL: 9.4 mg/dL (ref 0.0–40.0)

## 2023-08-29 LAB — FERRITIN: Ferritin: 20.7 ng/mL (ref 10.0–291.0)

## 2023-08-29 LAB — TSH: TSH: 2.67 u[IU]/mL (ref 0.35–5.50)

## 2023-08-30 LAB — URINE CULTURE: Result:: NO GROWTH

## 2023-09-19 ENCOUNTER — Encounter: Payer: Self-pay | Admitting: Internal Medicine

## 2023-09-20 ENCOUNTER — Other Ambulatory Visit (HOSPITAL_BASED_OUTPATIENT_CLINIC_OR_DEPARTMENT_OTHER): Payer: Self-pay

## 2023-09-20 ENCOUNTER — Emergency Department (HOSPITAL_BASED_OUTPATIENT_CLINIC_OR_DEPARTMENT_OTHER)
Admission: EM | Admit: 2023-09-20 | Discharge: 2023-09-20 | Disposition: A | Discharge status: Home / Self Care | Attending: Emergency Medicine | Admitting: Emergency Medicine

## 2023-09-20 ENCOUNTER — Other Ambulatory Visit (HOSPITAL_COMMUNITY): Payer: Self-pay

## 2023-09-20 ENCOUNTER — Other Ambulatory Visit: Payer: Self-pay

## 2023-09-20 ENCOUNTER — Emergency Department (HOSPITAL_BASED_OUTPATIENT_CLINIC_OR_DEPARTMENT_OTHER)

## 2023-09-20 ENCOUNTER — Other Ambulatory Visit: Payer: Self-pay | Admitting: Family Medicine

## 2023-09-20 ENCOUNTER — Encounter (HOSPITAL_BASED_OUTPATIENT_CLINIC_OR_DEPARTMENT_OTHER): Payer: Self-pay

## 2023-09-20 DIAGNOSIS — N200 Calculus of kidney: Secondary | ICD-10-CM

## 2023-09-20 DIAGNOSIS — N202 Calculus of kidney with calculus of ureter: Secondary | ICD-10-CM | POA: Diagnosis not present

## 2023-09-20 DIAGNOSIS — R1032 Left lower quadrant pain: Secondary | ICD-10-CM | POA: Diagnosis present

## 2023-09-20 DIAGNOSIS — N730 Acute parametritis and pelvic cellulitis: Secondary | ICD-10-CM

## 2023-09-20 DIAGNOSIS — N2882 Megaloureter: Secondary | ICD-10-CM | POA: Diagnosis not present

## 2023-09-20 DIAGNOSIS — N2889 Other specified disorders of kidney and ureter: Secondary | ICD-10-CM | POA: Diagnosis not present

## 2023-09-20 DIAGNOSIS — R109 Unspecified abdominal pain: Secondary | ICD-10-CM

## 2023-09-20 DIAGNOSIS — N139 Obstructive and reflux uropathy, unspecified: Secondary | ICD-10-CM | POA: Diagnosis not present

## 2023-09-20 LAB — URINALYSIS, ROUTINE W REFLEX MICROSCOPIC
Bilirubin Urine: NEGATIVE
Glucose, UA: NEGATIVE mg/dL
Hgb urine dipstick: NEGATIVE
Leukocytes,Ua: NEGATIVE
Nitrite: NEGATIVE
Specific Gravity, Urine: 1.024 (ref 1.005–1.030)
pH: 6 (ref 5.0–8.0)

## 2023-09-20 LAB — CBC
HCT: 35.3 % — ABNORMAL LOW (ref 36.0–46.0)
Hemoglobin: 11.9 g/dL — ABNORMAL LOW (ref 12.0–15.0)
MCH: 31.9 pg (ref 26.0–34.0)
MCHC: 33.7 g/dL (ref 30.0–36.0)
MCV: 94.6 fL (ref 80.0–100.0)
Platelets: 225 10*3/uL (ref 150–400)
RBC: 3.73 MIL/uL — ABNORMAL LOW (ref 3.87–5.11)
RDW: 12.4 % (ref 11.5–15.5)
WBC: 6.3 10*3/uL (ref 4.0–10.5)
nRBC: 0 % (ref 0.0–0.2)

## 2023-09-20 LAB — LIPASE, BLOOD: Lipase: 17 U/L (ref 11–51)

## 2023-09-20 LAB — COMPREHENSIVE METABOLIC PANEL WITH GFR
ALT: 14 U/L (ref 0–44)
AST: 24 U/L (ref 15–41)
Albumin: 4.4 g/dL (ref 3.5–5.0)
Alkaline Phosphatase: 52 U/L (ref 38–126)
Anion gap: 12 (ref 5–15)
BUN: 16 mg/dL (ref 6–20)
CO2: 24 mmol/L (ref 22–32)
Calcium: 9.5 mg/dL (ref 8.9–10.3)
Chloride: 104 mmol/L (ref 98–111)
Creatinine, Ser: 1.15 mg/dL — ABNORMAL HIGH (ref 0.44–1.00)
GFR, Estimated: 60 mL/min — ABNORMAL LOW (ref >60)
Glucose, Bld: 98 mg/dL (ref 70–99)
Potassium: 3.4 mmol/L — ABNORMAL LOW (ref 3.5–5.1)
Sodium: 140 mmol/L (ref 135–145)
Total Bilirubin: 0.6 mg/dL (ref 0.0–1.2)
Total Protein: 7.5 g/dL (ref 6.5–8.1)

## 2023-09-20 LAB — PREGNANCY, URINE: Preg Test, Ur: NEGATIVE

## 2023-09-20 MED ORDER — METRONIDAZOLE 500 MG PO TABS
500.0000 mg | ORAL_TABLET | Freq: Two times a day (BID) | ORAL | 0 refills | Status: AC
  Filled 2023-09-20 – 2023-09-30 (×2): qty 14, 7d supply, fill #0

## 2023-09-20 MED ORDER — MAGNESIUM SULFATE 2 GM/50ML IV SOLN
2.0000 g | Freq: Once | INTRAVENOUS | Status: AC
  Administered 2023-09-20: 2 g via INTRAVENOUS
  Filled 2023-09-20: qty 50

## 2023-09-20 MED ORDER — OXYCODONE-ACETAMINOPHEN 5-325 MG PO TABS
1.0000 | ORAL_TABLET | ORAL | 0 refills | Status: DC | PRN
  Filled 2023-09-20: qty 15, 3d supply, fill #0

## 2023-09-20 MED ORDER — ONDANSETRON 4 MG PO TBDP
4.0000 mg | ORAL_TABLET | Freq: Three times a day (TID) | ORAL | 0 refills | Status: DC | PRN
  Filled 2023-09-20: qty 20, 7d supply, fill #0

## 2023-09-20 MED ORDER — DOXYCYCLINE MONOHYDRATE 100 MG PO CAPS
100.0000 mg | ORAL_CAPSULE | Freq: Two times a day (BID) | ORAL | 0 refills | Status: AC
  Filled 2023-09-20 – 2023-09-30 (×2): qty 14, 7d supply, fill #0

## 2023-09-20 MED ORDER — HYDROMORPHONE HCL 1 MG/ML IJ SOLN
1.0000 mg | Freq: Once | INTRAMUSCULAR | Status: AC
  Administered 2023-09-20: 1 mg via INTRAVENOUS
  Filled 2023-09-20: qty 1

## 2023-09-20 MED ORDER — KETOROLAC TROMETHAMINE 15 MG/ML IJ SOLN
15.0000 mg | Freq: Once | INTRAMUSCULAR | Status: AC
  Administered 2023-09-20: 15 mg via INTRAVENOUS
  Filled 2023-09-20: qty 1

## 2023-09-20 MED ORDER — TAMSULOSIN HCL 0.4 MG PO CAPS
0.4000 mg | ORAL_CAPSULE | Freq: Every day | ORAL | 0 refills | Status: DC | PRN
  Filled 2023-09-20: qty 14, 14d supply, fill #0

## 2023-09-20 MED ORDER — ONDANSETRON HCL 4 MG/2ML IJ SOLN
4.0000 mg | Freq: Once | INTRAMUSCULAR | Status: AC
  Administered 2023-09-20: 4 mg via INTRAVENOUS
  Filled 2023-09-20: qty 2

## 2023-09-20 NOTE — Discharge Instructions (Signed)
 Your history, exam, workup today are consistent with kidney stone on the left side causing your pain and symptoms.  The CT scan showed a stone near the bladder and should pass soon or may have already passed.  With that size it would likely pass without difficulty however please use the pain medicine, nausea medicine, and Flomax to help with symptoms and help with passage has not yet passed already.  Please follow-up with urology and a primary doctor.  Please rest and stay hydrated.  If any symptoms change or worsen acutely, please turn to the nearest emergency department.

## 2023-09-20 NOTE — Progress Notes (Addendum)
 Patient is having increasing in abdominal pain.  Seems to be more in the left lower quadrant.  Some mild voluntary guarding but no rebound tenderness.  No masses appreciated.  Concern for potential acute diverticulitis.  Treat with doxycycline  and Flagyl .  Worsening pain do need to consider the possibility of CT scan.  KUB ordered today as well to rule out other differential including kidney stones with patient having the history of gross hematuria.  Patient acutely worsening throughout the day today left lower quadrant pain and periumbilical pain worsening with potentially some mild involuntary guarding noted.  Patient started become nauseous.  Encourage patient to go to emergency room for further imaging.  Have called charge nurse at the [facility] location.  They are aware that further imaging

## 2023-09-20 NOTE — ED Triage Notes (Signed)
 In for eval of intermittent sharp LLQ abd pain onset 2 days ago. Radiates to left back. Positive for diarrhea and urinary urgency. Denies N/V, fever or chills. Denies dysuria or hematuria at this time. Reports having hematuria x2 previously without pain. Saw PCP and has urology appt June 20.

## 2023-09-20 NOTE — ED Provider Notes (Signed)
 [place] EMERGENCY DEPARTMENT AT [facility] Provider Note   CSN: [id number] Arrival date & time: 09/20/23  0818     History  Chief Complaint  Patient presents with   Abdominal Pain    Chelsea Stephenson is a 43 y.o. female.  The history is provided by the patient and medical records. No language interpreter was used.  Abdominal Pain Pain location:  L flank Pain quality: aching and sharp   Pain radiates to:  LLQ Pain severity:  Severe Onset quality:  Gradual Duration:  2 days Timing:  Constant Progression:  Waxing and waning Chronicity:  New Context: not trauma   Relieved by:  Nothing Worsened by:  Nothing Ineffective treatments:  None tried Associated symptoms: hematuria (resolved) and nausea   Associated symptoms: no chest pain, no chills, no constipation, no cough, no diarrhea, no dysuria, no fatigue, no fever, no vaginal bleeding, no vaginal discharge and no vomiting   Risk factors: has not had multiple surgeries        Home Medications Prior to Admission medications   Medication Sig Start Date End Date Taking? Authorizing Provider  Ascorbic Acid (VITAMIN C) 500 MG CHEW     [provider]  B Complex-Folic Acid (B-COMPLEX/AMINO ACID PO)     [provider]  doxycycline  (MONODOX ) 100 MG capsule Take 1 capsule (100 mg total) by mouth 2 (two) times daily for 7 days. 09/20/23 09/27/23  Isidro Margo, DO  DULoxetine  (CYMBALTA ) 20 MG capsule Take 1 capsule (20 mg total) by mouth daily. 05/09/23   Smith, Zachary M, DO  Ferrous Sulfate (IRON) 28 MG TABS     [provider]  metroNIDAZOLE  (FLAGYL ) 500 MG tablet Take 1 tablet (500 mg total) by mouth 2 (two) times daily for 7 days. 09/20/23 09/27/23  Smith, Zachary M, DO  Vitamin D , Cholecalciferol, 1000 units CAPS Take 1 capsule by mouth daily.    [provider]  Zinc 50 MG TABS     [provider]      Allergies    Patient has no known allergies.    Review of Systems    Review of Systems  Constitutional:  Negative for chills, fatigue and fever.  HENT:  Negative for congestion.   Respiratory:  Negative for cough and chest tightness.   Cardiovascular:  Negative for chest pain.  Gastrointestinal:  Positive for abdominal pain and nausea. Negative for abdominal distention, constipation, diarrhea and vomiting.  Genitourinary:  Positive for flank pain and hematuria (resolved). Negative for decreased urine volume, dysuria, vaginal bleeding, vaginal discharge and vaginal pain.  Musculoskeletal:  Positive for back pain.  Skin:  Negative for rash and wound.  Neurological:  Negative for headaches.  Psychiatric/Behavioral:  Negative for agitation and confusion.   All other systems reviewed and are negative.   Physical Exam Updated Vital Signs BP 133/73 (BP Location: Right Arm)   Pulse 65   Temp 98.2 F (36.8 C) (Oral)   Resp 16   Ht 5\' 10"  (1.778 m)   Wt 65.8 kg   LMP 09/13/2023 (Approximate)   SpO2 100%   BMI 20.81 kg/m  Physical Exam Vitals and nursing note reviewed.  Constitutional:      General: She is not in acute distress.    Appearance: She is well-developed. She is not ill-appearing, toxic-appearing or diaphoretic.  HENT:     Head: Normocephalic and atraumatic.     Mouth/Throat:     Mouth: Mucous membranes are moist.  Eyes:  Conjunctiva/sclera: Conjunctivae normal.  Cardiovascular:     Rate and Rhythm: Normal rate and regular rhythm.     Heart sounds: No murmur heard. Pulmonary:     Effort: Pulmonary effort is normal. No respiratory distress.     Breath sounds: Normal breath sounds. No wheezing, rhonchi or rales.  Chest:     Chest wall: No tenderness.  Abdominal:     General: Abdomen is flat. Bowel sounds are normal.     Palpations: Abdomen is soft.     Tenderness: There is no abdominal tenderness. There is left CVA tenderness. There is no right CVA tenderness, guarding or rebound.  Musculoskeletal:        General: No swelling.      Cervical back: Neck supple.  Skin:    General: Skin is warm and dry.     Capillary Refill: Capillary refill takes less than 2 seconds.     Coloration: Skin is not pale.     Findings: No rash.  Neurological:     General: No focal deficit present.     Mental Status: She is alert.  Psychiatric:        Mood and Affect: Mood normal.     ED Results / Procedures / Treatments   Labs (all labs ordered are listed, but only abnormal results are displayed) Labs Reviewed  COMPREHENSIVE METABOLIC PANEL WITH GFR - Abnormal; Notable for the following components:      Result Value   Potassium 3.4 (*)    Creatinine, Ser 1.15 (*)    GFR, Estimated 60 (*)    All other components within normal limits  CBC - Abnormal; Notable for the following components:   RBC 3.73 (*)    Hemoglobin 11.9 (*)    HCT 35.3 (*)    All other components within normal limits  URINALYSIS, ROUTINE W REFLEX MICROSCOPIC - Abnormal; Notable for the following components:   Ketones, ur TRACE (*)    Protein, ur TRACE (*)    All other components within normal limits  LIPASE, BLOOD  PREGNANCY, URINE    EKG None  Radiology CT Renal Stone Study Result Date: 09/20/2023 CLINICAL DATA:  Abdominal flank pain, kidney stone suspected EXAM: CT ABDOMEN AND PELVIS WITHOUT CONTRAST TECHNIQUE: Multidetector CT imaging of the abdomen and pelvis was performed following the standard protocol without IV contrast. RADIATION DOSE REDUCTION: This exam was performed according to the departmental dose-optimization program which includes automated exposure control, adjustment of the mA and/or kV according to patient size and/or use of iterative reconstruction technique. COMPARISON:  None Available. FINDINGS: Lower chest: No infiltrates or consolidations, no pleural effusions Hepatobiliary: Liver normal size no masses no biliary dilatation. Gallbladder unremarkable. No gallstones. Pancreas: Pancreas normal size. No masses calcifications or inflammatory  changes. Spleen: Spleen normal size.  No masses. Adrenals/Urinary Tract: Adrenal glands are normal size. Follow-up recommended. Kidneys demonstrates multiple bilateral renal calcifications throughout the kidneys upper mid and lower pole calices measuring between 2 and 5 mm in diameter consistent with bilateral nephrolithiasis. With dilatation of the left renal pelvis and proximal ureter. There are 2 calcific densities in the left lower quadrant region of the pelvis the lateral 1 appears calcified phleboliths the medial 1 about 4-5 mm in diameter appears to represent a stone in the left distal ureter ureterovesical junction producing left-sided obstructive uropathy with mild to moderate hydronephrosis. Stomach/Bowel: No small or large bowel obstruction or inflammatory changes. Moderate amount of residual fecal material throughout the colon without obstruction or constipation. Vascular/Lymphatic:  No significant vascular findings are present. No enlarged abdominal or pelvic lymph nodes. Reproductive: .  No masses. Bladder unremarkable. Other: Anterior abdominal wall unremarkable without evidence of umbilical or inguinal hernias Musculoskeletal: Visualized portion of the thoracolumbar spine and pelvic structures grossly unremarkable without evidence of fracture bony abnormalities or soft tissue masses. IMPRESSION: *Bilateral nephrolithiasis. *Left-sided obstructive uropathy secondary to a 4-5 mm stone in the left distal ureter ureterovesical junction. *Moderate amount of residual fecal material throughout the colon without obstruction or constipation. Electronically Signed   By: Fredrich Jefferson M.D.   On: 09/20/2023 10:00    Procedures Procedures    Medications Ordered in ED Medications  HYDROmorphone (DILAUDID) injection 1 mg (1 mg Intravenous Given 09/20/23 0959)  ondansetron (ZOFRAN) injection 4 mg (4 mg Intravenous Given 09/20/23 0958)  magnesium sulfate IVPB 2 g 50 mL (0 g Intravenous Stopped 09/20/23 1128)   ketorolac  (TORADOL ) 15 MG/ML injection 15 mg (15 mg Intravenous Given 09/20/23 1052)    ED Course/ Medical Decision Making/ A&P                                 Medical Decision Making Amount and/or Complexity of Data Reviewed Labs: ordered. Radiology: ordered.  Risk Prescription drug management.    Karter Haire is a 43 y.o. female with a past medical history significant for recent hematuria who presents with left flank pain.  According to patient, patient had hematuria several weeks ago but did not have any pain.  Saw PCP and was was follow-up with urology.  Patient reports that since yesterday she started having pain in her left flank radiating towards her left abdomen that has been sharp and severe at times.  It was up to 9 out of 10 in severity when I first saw patient.  She reports nausea but no vomiting.  No constipation but is had some diarrhea.  No history of diverticulitis and no history of known kidney stones.  She denies any trauma.  Denies rashes to suggest shingles.  Denies fevers, chills, congestion, or cough.  No other complaints reported.  No anterior abdominal pain reported.  On exam, lungs clear.  Chest nontender.  Left flank and left CVA area were tender to palpation but left abdomen was not.  No lower abdominal tenderness.  Good bowel sounds.  Good pulses in extremities.  Patient otherwise does appear in shape and vital signs are reassuring.  Given the patient's appearance and ability to get comfortable I do suspect a kidney stone.  We will get a CT stone study as opposed to CT with contrast and we will get screening labs we will get urinalysis.  Will give the patient some pain medicine, nausea medicine, and will wait on Toradol  until we know there is not some large stone that would need intervention today.  CT scan returned showing a 4 mm left UVJ stone near the bladder.  This likely explains her symptoms.  She also had some stool but no clear obstruction.  Rest of imaging  did not show concerning findings.  Labs did not show convincing evidence of UTI with no nitrites or leukocytes.  Suspect renal colic and left sided pain due to kidney stone.  Will give a shot of Toradol  and reassess.  Patient's pain improving, anticipate discharge home for outpatient urology follow-up.  Patient agrees with plan of care, anticipate reassessment shortly.  After Toradol  the pain spikes have improved.  Patient is  feeling better.  Will discharge for outpatient neurology and PCP follow-up and will give prescription for pain medicine, nausea medicine, and Flomax.  Patient agrees to increase hydration and follow-up.  She had no other questions or concerns and will be discharged for outpatient follow-up.         Final Clinical Impression(s) / ED Diagnoses Final diagnoses:  Kidney stone on left side  Flank pain    Rx / DC Orders ED Discharge Orders          Ordered    ondansetron (ZOFRAN-ODT) 4 MG disintegrating tablet  Every 8 hours PRN        09/20/23 1246    oxyCODONE-acetaminophen (PERCOCET/ROXICET) 5-325 MG tablet  Every 4 hours PRN        09/20/23 1246    tamsulosin (FLOMAX) 0.4 MG CAPS capsule  Daily PRN        09/20/23 1246             Clinical Impression: 1. Kidney stone on left side   2. Flank pain     Disposition: Discharge  Condition: Good  I have discussed the results, Dx and Tx plan with the pt(& family if present). He/she/they expressed understanding and agree(s) with the plan. Discharge instructions discussed at great length. Strict return precautions discussed and pt &/or family have verbalized understanding of the instructions. No further questions at time of discharge.    New Prescriptions   ONDANSETRON (ZOFRAN-ODT) 4 MG DISINTEGRATING TABLET    Take 1 tablet (4 mg total) by mouth every 8 (eight) hours as needed for nausea or vomiting.   OXYCODONE-ACETAMINOPHEN (PERCOCET/ROXICET) 5-325 MG TABLET    Take 1 tablet by mouth every 4 (four)  hours as needed for severe pain (pain score 7-10).   TAMSULOSIN (FLOMAX) 0.4 MG CAPS CAPSULE    Take 1 capsule (0.4 mg total) by mouth daily as needed.    Follow Up: [place], [facility] [address] [place] [address] [phone]     [facility] Emergency Department at [facility] [address] [place]  [id number] [phone]       Matheu Ploeger, Marine Sia, MD 09/20/23 [phone]

## 2023-09-29 ENCOUNTER — Other Ambulatory Visit (HOSPITAL_COMMUNITY): Payer: Self-pay

## 2023-09-30 ENCOUNTER — Other Ambulatory Visit (HOSPITAL_COMMUNITY): Payer: Self-pay

## 2023-09-30 ENCOUNTER — Other Ambulatory Visit (HOSPITAL_BASED_OUTPATIENT_CLINIC_OR_DEPARTMENT_OTHER): Payer: Self-pay

## 2023-10-06 ENCOUNTER — Other Ambulatory Visit (HOSPITAL_COMMUNITY): Payer: Self-pay

## 2023-10-06 DIAGNOSIS — N201 Calculus of ureter: Secondary | ICD-10-CM | POA: Diagnosis not present

## 2023-10-06 DIAGNOSIS — R350 Frequency of micturition: Secondary | ICD-10-CM | POA: Diagnosis not present

## 2023-10-06 DIAGNOSIS — R8271 Bacteriuria: Secondary | ICD-10-CM | POA: Diagnosis not present

## 2023-10-11 ENCOUNTER — Other Ambulatory Visit (HOSPITAL_COMMUNITY): Payer: Self-pay

## 2023-10-11 ENCOUNTER — Other Ambulatory Visit: Payer: Self-pay | Admitting: Family Medicine

## 2023-10-11 MED ORDER — COLCHICINE 0.6 MG PO TABS
0.6000 mg | ORAL_TABLET | Freq: Two times a day (BID) | ORAL | 0 refills | Status: DC
  Filled 2023-10-11: qty 10, 5d supply, fill #0

## 2023-10-11 NOTE — Progress Notes (Signed)
 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  Will start with colchicine 2 times a day for the next 5 days.  Prescription sent in.  Can change then to meloxicam  otherwise.  Worsening chest pain, shortness of breath worsening dizziness further workup with cardiology would be necessary again but we have done this in the past.  No change in medication recently.  Differential would include GI relation.

## 2023-10-12 ENCOUNTER — Other Ambulatory Visit: Payer: Self-pay | Admitting: Family Medicine

## 2023-10-17 ENCOUNTER — Other Ambulatory Visit: Payer: Self-pay | Admitting: Family Medicine

## 2023-10-17 ENCOUNTER — Other Ambulatory Visit (HOSPITAL_COMMUNITY): Payer: Self-pay

## 2023-10-17 MED ORDER — MELOXICAM 15 MG PO TABS
15.0000 mg | ORAL_TABLET | Freq: Every day | ORAL | 0 refills | Status: DC
  Filled 2023-10-17: qty 30, 30d supply, fill #0

## 2023-10-17 MED ORDER — PREDNISONE 20 MG PO TABS
20.0000 mg | ORAL_TABLET | Freq: Every day | ORAL | 0 refills | Status: DC
  Filled 2023-10-17: qty 10, 10d supply, fill #0

## 2023-10-17 NOTE — Progress Notes (Signed)
 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

## 2023-10-18 ENCOUNTER — Other Ambulatory Visit (HOSPITAL_COMMUNITY): Payer: Self-pay

## 2023-11-06 DIAGNOSIS — N201 Calculus of ureter: Secondary | ICD-10-CM | POA: Diagnosis not present

## 2023-11-06 DIAGNOSIS — N202 Calculus of kidney with calculus of ureter: Secondary | ICD-10-CM | POA: Diagnosis not present

## 2024-02-09 ENCOUNTER — Encounter: Payer: Self-pay | Admitting: Obstetrics and Gynecology

## 2024-02-09 NOTE — Progress Notes (Unsigned)
 43 y.o. G43P0000 Married Caucasian female here for annual exam.    Has more fatigue with her menstrual cycles.  Menses every 21 - 24 days, used to be every 28.   Heavy first 2 days.   Uses a menstrual cup.  Some cramping and is manageable.     Some hot flashes prior to menstruation.    Treated for presumed pelvic inflammatory disease with doxycycline . Rocephin , and keflex  when use of her menstrual cup was painful.    No partner change.    Is an event organiser.   From [place] .     PCP: Geofm Glade PARAS, MD   Patient's last menstrual period was 02/05/2024 (approximate).     Period Cycle (Days): 22 Period Duration (Days): 6-7 Period Pattern: Regular Menstrual Flow: Heavy, Moderate (Varies) Menstrual Control: Other (Comment) (Cup) Dysmenorrhea: (!) Moderate Dysmenorrhea Symptoms: Cramping     Sexually active: Yes.    The current method of family planning is none.  Female partner.   Menopausal hormone therapy:  n/a Exercising: Yes.    5x week bike riding and weight lifting  Smoker:  no  OB History  Gravida Para Term Preterm AB Living  0 0 0 0 0 0  SAB IAB Ectopic Multiple Live Births  0 0 0 0 0     HEALTH MAINTENANCE: Last 2 paps:  05/26/15 neg  History of abnormal Pap or positive HPV:  no Mammogram:   06/05/23 Breast Density Cat D, BIRADS Cat 1 neg  Colonoscopy:  n/a Bone Density:  n/a  Result  n/a   Immunization History  Administered Date(s) Administered   Influenza,inj,Quad PF,6+ Mos 01/05/2018   Influenza-Unspecified 01/17/2019   Tdap 04/19/2007, 05/26/2015      reports that she has never smoked. She has never used smokeless tobacco. She reports current alcohol use of about 2.0 standard drinks of alcohol per week. She reports that she does not use drugs.  Past Medical History:  Diagnosis Date   Anxiety    Renal stone     Past Surgical History:  Procedure Laterality Date   BUNIONECTOMY     SHOULDER SURGERY      Current Outpatient Medications   Medication Sig Dispense Refill   Ascorbic Acid (VITAMIN C) 500 MG CHEW      B Complex-Folic Acid (B-COMPLEX/AMINO ACID PO)      DULoxetine  (CYMBALTA ) 20 MG capsule Take 1 capsule (20 mg total) by mouth daily. 90 capsule 3   Vitamin D , Cholecalciferol, 1000 units CAPS Take 1 capsule by mouth daily.     Zinc 50 MG TABS      No current facility-administered medications for this visit.    ALLERGIES: Patient has no known allergies.  Family History  Problem Relation Age of Onset   Cancer Maternal Grandmother        uterine cancer   Cancer Maternal Grandfather        prostate   Heart disease Maternal Grandfather    Cancer Paternal Grandmother        stomach cancer    Review of Systems  All other systems reviewed and are negative.   PHYSICAL EXAM:  BP 126/82 (BP Location: Left Arm, Patient Position: Sitting)   Pulse 88   Ht 5' 10.5 (1.791 m)   Wt 145 lb (65.8 kg)   LMP 02/05/2024 (Approximate)   SpO2 98%   BMI 20.51 kg/m     General appearance: alert, cooperative and appears stated age Head: normocephalic, without obvious abnormality,  atraumatic Neck: no adenopathy, supple, symmetrical, trachea midline and thyroid  normal to inspection and palpation Lungs: clear to auscultation bilaterally Breasts: normal appearance, no masses or tenderness, No nipple retraction or dimpling, No nipple discharge or bleeding, No axillary adenopathy Heart: regular rate and rhythm Abdomen: soft, non-tender; no masses, no organomegaly Extremities: extremities normal, atraumatic, no cyanosis or edema Skin: skin color, texture, turgor normal. No rashes or lesions Lymph nodes: cervical, supraclavicular, and axillary nodes normal. Neurologic: grossly normal  Pelvic: External genitalia:  no lesions              No abnormal inguinal nodes palpated.              Urethra:  normal appearing urethra with no masses, tenderness or lesions              Bartholins and Skenes: normal                 Vagina:  normal appearing vagina with normal color and discharge, no lesions              Cervix: no lesions              Pap taken: {yes no:314532} Bimanual Exam:  Uterus:  normal size, contour, position, consistency, mobility, non-tender              Adnexa: no mass, fullness, tenderness              Rectal exam: {yes no:314532}.  Confirms.              Anus:  normal sphincter tone, no lesions  Chaperone was present for exam:  {BSCHAPERONE:31226::Emily F, CMA}  ASSESSMENT: Well woman visit with gynecologic exam. Cervical cancer screening.  Hx renal stone.   PHQ-2-9: ***  ***  PLAN: Mammogram screening discussed. Self breast awareness reviewed. Pap and HRV collected:  {yes no:314532} Guidelines for Calcium, Vitamin D , regular exercise program including cardiovascular and weight bearing exercise. Medication refills:  *** {LABS (Optional):23779} Follow up:  ***    Additional counseling given.  {yes [id number]. ***  total time was spent for this patient encounter, including preparation, face-to-face counseling with the patient, coordination of care, and documentation of the encounter in addition to doing the well woman visit with gynecologic exam.

## 2024-02-12 ENCOUNTER — Encounter: Payer: Self-pay | Admitting: Obstetrics and Gynecology

## 2024-02-12 ENCOUNTER — Other Ambulatory Visit (HOSPITAL_COMMUNITY)
Admission: RE | Admit: 2024-02-12 | Discharge: 2024-02-12 | Disposition: A | Discharge status: Home / Self Care | Source: Ambulatory Visit | Attending: Obstetrics and Gynecology | Admitting: Obstetrics and Gynecology

## 2024-02-12 ENCOUNTER — Ambulatory Visit: Admitting: Obstetrics and Gynecology

## 2024-02-12 VITALS — BP 126/82 | HR 88 | Ht 70.5 in | Wt 145.0 lb

## 2024-02-12 DIAGNOSIS — Z1331 Encounter for screening for depression: Secondary | ICD-10-CM | POA: Diagnosis not present

## 2024-02-12 DIAGNOSIS — Z01419 Encounter for gynecological examination (general) (routine) without abnormal findings: Secondary | ICD-10-CM | POA: Diagnosis not present

## 2024-02-12 DIAGNOSIS — Z124 Encounter for screening for malignant neoplasm of cervix: Secondary | ICD-10-CM | POA: Insufficient documentation

## 2024-02-12 DIAGNOSIS — Z1211 Encounter for screening for malignant neoplasm of colon: Secondary | ICD-10-CM

## 2024-02-12 NOTE — Patient Instructions (Signed)
 Calcium in Foods Calcium is a mineral in the body. Of all the minerals in your body, you have the most calcium. Most of the body's calcium supply is stored in bones and teeth. Calcium helps many parts of the body work, including: Blood and blood vessels. Nerves. Hormones. Muscles. Bones and teeth. When your calcium stores are low, you may be at risk for low bone mass, bone loss, and broken bones. When you get enough calcium, it helps to support strong bones and teeth throughout your life. Calcium is especially important for: Children during growth spurts. Females during adolescence. Females who are pregnant or breastfeeding. Females after their menstrual cycle stops (postmenopausal). Females whose menstrual cycle has stopped because of an eating disorder or regular intense exercise. People who can't eat or digest dairy products. People who eat a vegan diet. Recommended daily amounts of calcium: Females (ages 87 to 55): 1,000 mg per day. Females (ages 35 and older): 1,200 mg per day. Males (ages 53 to 45): 1,000 mg per day. Males (ages 43 and older): 1,200 mg per day. Females (ages 88 to 28): 1,300 mg per day. Males (ages 41 to 56): 1,300 mg per day. General information Eat foods that are high in calcium. Try to get most of your calcium from food. Some people may benefit from taking calcium supplements. Check with your health care provider or an expert in healthy eating called a dietitian before starting any calcium supplements. Calcium supplements may interact with certain medicines. Too much calcium may cause other health problems, such as trouble pooping and kidney stones. For the body to absorb calcium, it needs vitamin D. Sources of vitamin D include: Skin exposure to direct sunlight. Foods, such as egg yolks, liver, mushrooms, saltwater fish, and fortified milk. Vitamin D supplements. Check with your provider or dietitian before starting any vitamin D supplements. The amount of  calcium that is absorbed in the body varies with type of food. Talk to a dietitian about what foods are best for you, especially if you are eat a vegan diet or don't eat dairy. What foods are high in calcium?  Foods that are high in calcium contain more than 100 milligrams per serving. Fruits Fortified orange juice or other fruit juice, 300 mg per 8 oz (237 mL) serving. Vegetables Collard greens, 260 mg per 1 cup (130 g) serving, cooked. Kale, 180 mg per 1 cup (118 g) serving, cooked. Bok choy, 180 mg per 1 cup (170 g) serving, cooked Grains Fortified frozen waffles, 200 mg in 2 waffles. Oatmeal, 180 mg in 1 cup (234 g) serving, cooked. Fortified white bread, 175 mg per slice. Meats and other proteins Sardines, canned with bones, 350 mg per 3.75 oz (92 g) serving. Salmon, canned with bones, 168 mg per 3 oz (85 g) serving. Canned shrimp, 125 mg per 3 oz (85 g) serving. Baked beans, 120 mg per 1 cup (266 g) serving. Tofu, firm, made with calcium sulfate, 861 mg per  cup (126 g) serving. Dairy Yogurt, plain, low-fat, 448 mg per 1 cup (245 g) serving Nonfat milk, 300 mg per 1 cup (245 g) serving. American cheese, 145 mg per 1 oz (21 g) serving or 1 slice. Cheddar cheese, 200 mg per 1 oz (28 g) serving or 1 slice. Cottage cheese 2%, 125 mg per  cup (113 g) serving. Fortified soy, rice, or almond milk, 300 mg per 1 cup (237 mL) serving. Mozzarella, part skim, 210 mg per 1 oz (21 g) serving. The items listed  above may not be a complete list of foods high in calcium. Actual amounts of calcium may be different depending on processing. Contact a dietitian for more information. What foods are lower in calcium? Foods that are lower in calcium contain 50 mg or less per serving. Fruits Apple, 1 medium, about 6 mg. Banana, 1 medium, about 12 mg. Vegetables Lettuce, 19 mg per 1 cup (35 g) serving. Tomato, 1 small, about 11 mg. Grains Rice, white, 8 mg per  cup (79 g) serving. Boiled  potatoes, 14 mg per 1 cup (160 g) serving. White bread, 6 mg per slice. Meats and other proteins Egg, 24 mg per 1 egg (50 g). Red meat, 7 mg per 4 oz (80 g) serving. Chicken, 17 mg per 4 oz (113 g) serving. Fish, cod, or trout, 20 mg per 4 oz (140 g) serving. Dairy Cream cheese, regular, 14 mg per 1 Tbsp (15 g) serving. Brie cheese, 50 mg per 1 oz (32 g) serving. The items listed above may not be a complete list of foods lower in calcium. Actual amounts of calcium may be different depending on processing. Contact a dietitian for more information. This information is not intended to replace advice given to you by your health care provider. Make sure you discuss any questions you have with your health care provider. Document Revised: 12/31/2022 Document Reviewed: 12/31/2022 Elsevier Patient Education  2024 [employer]  EXERCISE AND DIET:  We recommended that you start or continue a regular exercise program for good health. Regular exercise means any activity that makes your heart beat faster and makes you sweat.  We recommend exercising at least 30 minutes per day at least 3 days a week, preferably 4 or 5.  We also recommend a diet low in fat and sugar.  Inactivity, poor dietary choices and obesity can cause diabetes, heart attack, stroke, and kidney damage, among others.    ALCOHOL AND SMOKING:  Women should limit their alcohol intake to no more than 7 drinks/beers/glasses of wine (combined, not each!) per week. Moderation of alcohol intake to this level decreases your risk of breast cancer and liver damage. And of course, no recreational drugs are part of a healthy lifestyle.  And absolutely no smoking or even second hand smoke. Most people know smoking can cause heart and lung diseases, but did you know it also contributes to weakening of your bones? Aging of your skin?  Yellowing of your teeth and nails?  CALCIUM AND VITAMIN D:  Adequate intake of calcium and Vitamin D are recommended.  The  recommendations for exact amounts of these supplements seem to change often, but generally speaking 600 mg of calcium (either carbonate or citrate) and 800 units of Vitamin D per day seems prudent. Certain women may benefit from higher intake of Vitamin D.  If you are among these women, your doctor will have told you during your visit.    PAP SMEARS:  Pap smears, to check for cervical cancer or precancers,  have traditionally been done yearly, although recent scientific advances have shown that most women can have pap smears less often.  However, every woman still should have a physical exam from her gynecologist every year. It will include a breast check, inspection of the vulva and vagina to check for abnormal growths or skin changes, a visual exam of the cervix, and then an exam to evaluate the size and shape of the uterus and ovaries.  And after 45 years of age, a rectal exam  is indicated to check for rectal cancers. We will also provide age appropriate advice regarding health maintenance, like when you should have certain vaccines, screening for sexually transmitted diseases, bone density testing, colonoscopy, mammograms, etc.   MAMMOGRAMS:  All women over 11 years old should have a yearly mammogram. Many facilities now offer a "3D" mammogram, which may cost around $50 extra out of pocket. If possible,  we recommend you accept the option to have the 3D mammogram performed.  It both reduces the number of women who will be called back for extra views which then turn out to be normal, and it is better than the routine mammogram at detecting truly abnormal areas.    COLONOSCOPY:  Colonoscopy to screen for colon cancer is recommended for all women at age 8.  We know, you hate the idea of the prep.  We agree, BUT, having colon cancer and not knowing it is worse!!  Colon cancer so often starts as a polyp that can be seen and removed at colonscopy, which can quite literally save your life!  And if your first  colonoscopy is normal and you have no family history of colon cancer, most women don't have to have it again for 10 years.  Once every ten years, you can do something that may end up saving your life, right?  We will be happy to help you get it scheduled when you are ready.  Be sure to check your insurance coverage so you understand how much it will cost.  It may be covered as a preventative service at no cost, but you should check your particular policy.

## 2024-02-13 ENCOUNTER — Ambulatory Visit: Payer: Self-pay | Admitting: Obstetrics and Gynecology

## 2024-02-13 LAB — CYTOLOGY - PAP
Comment: NEGATIVE
Diagnosis: NEGATIVE
High risk HPV: NEGATIVE

## 2024-02-20 DIAGNOSIS — D225 Melanocytic nevi of trunk: Secondary | ICD-10-CM | POA: Diagnosis not present

## 2024-02-20 DIAGNOSIS — L814 Other melanin hyperpigmentation: Secondary | ICD-10-CM | POA: Diagnosis not present

## 2024-02-20 DIAGNOSIS — D2272 Melanocytic nevi of left lower limb, including hip: Secondary | ICD-10-CM | POA: Diagnosis not present

## 2024-02-20 DIAGNOSIS — L578 Other skin changes due to chronic exposure to nonionizing radiation: Secondary | ICD-10-CM | POA: Diagnosis not present

## 2024-04-08 ENCOUNTER — Other Ambulatory Visit: Payer: Self-pay | Admitting: Family Medicine

## 2024-04-08 ENCOUNTER — Other Ambulatory Visit (HOSPITAL_COMMUNITY): Payer: Self-pay

## 2024-04-08 DIAGNOSIS — R310 Gross hematuria: Secondary | ICD-10-CM

## 2024-04-08 MED ORDER — AMOXICILLIN-POT CLAVULANATE 875-125 MG PO TABS
1.0000 | ORAL_TABLET | Freq: Two times a day (BID) | ORAL | 0 refills | Status: AC
  Filled 2024-04-08: qty 20, 10d supply, fill #0

## 2024-04-08 NOTE — Progress Notes (Signed)
 Recurrent gross hematuria, history of kidney stone, will treat prophylactically at this time.

## 2024-04-08 NOTE — Assessment & Plan Note (Signed)
 Recurrent gross hematuria, history of kidney stone, will treat prophylactically at this time.

## 2024-04-22 ENCOUNTER — Other Ambulatory Visit (HOSPITAL_COMMUNITY): Payer: Self-pay

## 2024-04-22 ENCOUNTER — Other Ambulatory Visit: Payer: Self-pay | Admitting: Family Medicine

## 2024-04-22 MED ORDER — DULOXETINE HCL 20 MG PO CPEP
20.0000 mg | ORAL_CAPSULE | Freq: Every day | ORAL | 3 refills | Status: AC
  Filled 2024-04-22: qty 90, 90d supply, fill #0

## 2024-05-03 ENCOUNTER — Encounter: Payer: Self-pay | Admitting: Gastroenterology

## 2024-05-15 ENCOUNTER — Other Ambulatory Visit

## 2024-05-15 ENCOUNTER — Other Ambulatory Visit: Payer: Self-pay | Admitting: Family Medicine

## 2024-05-15 DIAGNOSIS — R310 Gross hematuria: Secondary | ICD-10-CM | POA: Diagnosis not present

## 2024-05-15 DIAGNOSIS — N920 Excessive and frequent menstruation with regular cycle: Secondary | ICD-10-CM | POA: Diagnosis not present

## 2024-05-15 DIAGNOSIS — D649 Anemia, unspecified: Secondary | ICD-10-CM

## 2024-05-15 LAB — COMPREHENSIVE METABOLIC PANEL WITH GFR
ALT: 12 U/L (ref 3–35)
AST: 18 U/L (ref 5–37)
Albumin: 4.2 g/dL (ref 3.5–5.2)
Alkaline Phosphatase: 48 U/L (ref 39–117)
BUN: 13 mg/dL (ref 6–23)
CO2: 29 meq/L (ref 19–32)
Calcium: 9 mg/dL (ref 8.4–10.5)
Chloride: 106 meq/L (ref 96–112)
Creatinine, Ser: 0.74 mg/dL (ref 0.40–1.20)
GFR: 97.84 mL/min
Glucose, Bld: 93 mg/dL (ref 70–99)
Potassium: 3.7 meq/L (ref 3.5–5.1)
Sodium: 139 meq/L (ref 135–145)
Total Bilirubin: 0.4 mg/dL (ref 0.2–1.2)
Total Protein: 7.1 g/dL (ref 6.0–8.3)

## 2024-05-15 LAB — VITAMIN B12: Vitamin B-12: 404 pg/mL (ref 211–911)

## 2024-05-15 LAB — IRON,TIBC AND FERRITIN PANEL
%SAT: 21 % (ref 16–45)
Ferritin: 31 ng/mL (ref 16–232)
Iron: 53 ug/dL (ref 40–190)
TIBC: 248 ug/dL — ABNORMAL LOW (ref 250–450)

## 2024-05-15 LAB — VITAMIN D 25 HYDROXY (VIT D DEFICIENCY, FRACTURES): VITD: 47.17 ng/mL (ref 30.00–100.00)

## 2024-05-15 LAB — SEDIMENTATION RATE: Sed Rate: 14 mm/h (ref 0–20)

## 2024-05-15 LAB — URIC ACID: Uric Acid, Serum: 3.4 mg/dL (ref 2.4–7.0)
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
## 2024-05-16 ENCOUNTER — Ambulatory Visit: Payer: Self-pay | Admitting: Family Medicine

## 2024-05-17 LAB — PTH, INTACT AND CALCIUM
Calcium: 9 mg/dL (ref 8.6–10.2)
PTH: 15 pg/mL — ABNORMAL LOW (ref 16–77)

## 2024-05-22 ENCOUNTER — Other Ambulatory Visit: Payer: Self-pay | Admitting: Family Medicine

## 2024-05-22 ENCOUNTER — Other Ambulatory Visit (HOSPITAL_COMMUNITY): Payer: Self-pay

## 2024-05-22 ENCOUNTER — Other Ambulatory Visit: Payer: Self-pay

## 2024-05-22 MED ORDER — PREDNISONE 20 MG PO TABS
20.0000 mg | ORAL_TABLET | Freq: Every day | ORAL | 0 refills | Status: AC
  Filled 2024-05-22: qty 5, 5d supply, fill #0

## 2024-05-22 MED ORDER — ALLOPURINOL 300 MG PO TABS
300.0000 mg | ORAL_TABLET | Freq: Every day | ORAL | 11 refills | Status: AC
  Filled 2024-05-22: qty 90, 90d supply, fill #0

## 2024-05-22 NOTE — Progress Notes (Signed)
 Has been started on allopurinol  by another provider.  Will give a short course of prednisone  20 mg to take for the next 3 days while patient bridges.

## 2024-05-23 ENCOUNTER — Encounter: Payer: Self-pay | Admitting: Pharmacist

## 2024-05-23 ENCOUNTER — Other Ambulatory Visit: Payer: Self-pay

## 2024-05-23 ENCOUNTER — Other Ambulatory Visit (HOSPITAL_COMMUNITY): Payer: Self-pay

## 2024-06-14 ENCOUNTER — Encounter (HOSPITAL_BASED_OUTPATIENT_CLINIC_OR_DEPARTMENT_OTHER): Admitting: Radiology

## 2024-06-14 DIAGNOSIS — Z1231 Encounter for screening mammogram for malignant neoplasm of breast: Secondary | ICD-10-CM

## 2024-06-28 ENCOUNTER — Encounter

## 2024-07-12 ENCOUNTER — Encounter: Admitting: Gastroenterology

## 2025-02-18 ENCOUNTER — Ambulatory Visit: Admitting: Obstetrics and Gynecology
